# Patient Record
Sex: Male | Born: 1975 | Race: Black or African American | Hispanic: No | Marital: Single | Smoking: Never smoker
Health system: Southern US, Community
[De-identification: ages and names within clinical notes are randomized; demographics above are authoritative.]

## PROBLEM LIST (undated history)

## (undated) HISTORY — PX: APPENDECTOMY: SHX54

---

## 2000-06-02 ENCOUNTER — Encounter: Payer: Self-pay | Admitting: Emergency Medicine

## 2000-06-02 ENCOUNTER — Emergency Department (HOSPITAL_COMMUNITY): Admission: EM | Admit: 2000-06-02 | Discharge: 2000-06-02 | Payer: Self-pay | Admitting: Emergency Medicine

## 2009-04-01 ENCOUNTER — Encounter: Payer: Self-pay | Admitting: Orthopedic Surgery

## 2009-04-01 ENCOUNTER — Emergency Department (HOSPITAL_COMMUNITY): Admission: EM | Admit: 2009-04-01 | Discharge: 2009-04-01 | Payer: Self-pay | Admitting: Emergency Medicine

## 2009-04-06 ENCOUNTER — Emergency Department (HOSPITAL_COMMUNITY): Admission: EM | Admit: 2009-04-06 | Discharge: 2009-04-06 | Payer: Self-pay | Admitting: Emergency Medicine

## 2009-04-13 ENCOUNTER — Emergency Department (HOSPITAL_COMMUNITY): Admission: EM | Admit: 2009-04-13 | Discharge: 2009-04-13 | Payer: Self-pay | Admitting: Emergency Medicine

## 2009-04-16 ENCOUNTER — Ambulatory Visit (HOSPITAL_COMMUNITY): Admission: RE | Admit: 2009-04-16 | Discharge: 2009-04-16 | Payer: Self-pay | Admitting: Orthopedic Surgery

## 2009-04-16 ENCOUNTER — Ambulatory Visit: Payer: Self-pay | Admitting: Orthopedic Surgery

## 2009-04-16 DIAGNOSIS — S20229A Contusion of unspecified back wall of thorax, initial encounter: Secondary | ICD-10-CM | POA: Insufficient documentation

## 2009-04-16 DIAGNOSIS — S7000XA Contusion of unspecified hip, initial encounter: Secondary | ICD-10-CM

## 2009-04-16 DIAGNOSIS — S8000XA Contusion of unspecified knee, initial encounter: Secondary | ICD-10-CM | POA: Insufficient documentation

## 2009-04-17 ENCOUNTER — Encounter: Payer: Self-pay | Admitting: Orthopedic Surgery

## 2009-04-18 ENCOUNTER — Encounter: Payer: Self-pay | Admitting: Orthopedic Surgery

## 2009-04-23 ENCOUNTER — Ambulatory Visit: Payer: Self-pay | Admitting: Orthopedic Surgery

## 2009-04-23 ENCOUNTER — Emergency Department (HOSPITAL_COMMUNITY): Admission: EM | Admit: 2009-04-23 | Discharge: 2009-04-23 | Payer: Self-pay | Admitting: Emergency Medicine

## 2009-04-24 ENCOUNTER — Telehealth: Payer: Self-pay | Admitting: Orthopedic Surgery

## 2009-05-04 ENCOUNTER — Telehealth: Payer: Self-pay | Admitting: Orthopedic Surgery

## 2009-06-12 ENCOUNTER — Encounter: Payer: Self-pay | Admitting: Orthopedic Surgery

## 2010-03-05 NOTE — Letter (Signed)
Summary: Medical record request [facility]  Medical record request [facility]   Imported By: Cammie Sickle 06/16/2009 18:00:19  _____________________________________________________________________  External Attachment:    Type:   Image     Comment:   External Document

## 2010-03-05 NOTE — Letter (Signed)
Summary: History form  History form   Imported By: Jacklynn Ganong 04/25/2009 16:17:39  _____________________________________________________________________  External Attachment:    Type:   Image     Comment:   External Document

## 2010-03-05 NOTE — Letter (Signed)
Summary: Xray order  Xray order   Imported By: Cammie Sickle 04/16/2009 10:33:38  _____________________________________________________________________  External Attachment:    Type:   Image     Comment:   External Document
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
## 2010-03-05 NOTE — Letter (Signed)
Summary: Out of Work  [facility]  [address]  [place], [place] [address]   Phone: [phone]  Fax: [phone]    April 16, 2009   Employee:  Vinny A Dayhoff    To Whom It May Concern:   For Medical reasons, please excuse the above named employee from work for the following dates:  Start:   04-16-2009  End:   04-17-2009  If you need additional information, please feel free to contact our office.         Sincerely,    Fuller Canada MD

## 2010-03-05 NOTE — Progress Notes (Signed)
Summary: Referral to Chiropractor.  Phone Note Outgoing Call   Call placed by: Waldon Reining,  April 24, 2009 11:18 AM Call placed to: Specialist Action Taken: Information Sent Summary of Call: I faxed a referral for this patient to Dr. Reatha Harps, Chiropractor.
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
## 2010-03-05 NOTE — Letter (Signed)
Summary: Medical record request [facility] [employer] [facility]  Medical record request [facility] [facility]   Imported By: Cammie Sickle 05/28/2009 08:57:19  _____________________________________________________________________  External Attachment:    Type:   Image     Comment:   External Document

## 2010-03-05 NOTE — Letter (Signed)
Summary: Out of Work 04/18/09- 05/30/09  [facility]  [address]  [place], [place] [address]   Phone: [phone]  Fax: [phone]    April 23, 2009   Employee:  Prateek A Ursua    To Whom It May Concern:   For Medical reasons, please excuse the above named employee from work for the following dates:  Start:   Weds 16th March 2011  End:   Wed apr 27th 2011   If you need additional information, please feel free to contact our office.         Sincerely,    Fuller Canada MD

## 2010-03-05 NOTE — Letter (Signed)
Summary: Out of Work  [facility]  [address]  [place], [place] [address]   Phone: [phone]  Fax: [phone]    April 23, 2009   Employee:  Ji A Gillooly    To Whom It May Concern:   For Medical reasons, please excuse the above named employee from work for the following dates:  Start:    End:    If you need additional information, please feel free to contact our office.         Sincerely,    Fuller Canada MD

## 2010-03-05 NOTE — Letter (Signed)
Summary: Letter [employer]   Letter [employer]   Imported By: Cammie Sickle 06/05/2009 10:48:56  _____________________________________________________________________  External Attachment:    Type:   Image     Comment:   External Document

## 2010-03-05 NOTE — Progress Notes (Signed)
Summary: Patient not scheduled yet  Phone Note From Other Clinic   Summary of Call: [name] at Dr. Lubertha Basque office called to say she has not scheduled Johnny Andrade yet because he has not returned her calls.  She has left messages for him but has not heard from him yet. Initial call taken by: Jacklynn Ganong,  May 04, 2009 8:52 AM

## 2010-04-02 ENCOUNTER — Emergency Department (HOSPITAL_COMMUNITY): Payer: No Typology Code available for payment source

## 2010-04-02 ENCOUNTER — Emergency Department (HOSPITAL_COMMUNITY)
Admission: EM | Admit: 2010-04-02 | Discharge: 2010-04-02 | Disposition: A | Discharge status: Home / Self Care | Payer: No Typology Code available for payment source | Attending: Emergency Medicine | Admitting: Emergency Medicine

## 2010-04-02 DIAGNOSIS — M549 Dorsalgia, unspecified: Secondary | ICD-10-CM | POA: Insufficient documentation

## 2010-04-02 DIAGNOSIS — S139XXA Sprain of joints and ligaments of unspecified parts of neck, initial encounter: Secondary | ICD-10-CM | POA: Insufficient documentation

## 2010-04-02 DIAGNOSIS — S8000XA Contusion of unspecified knee, initial encounter: Secondary | ICD-10-CM | POA: Insufficient documentation

## 2014-10-10 ENCOUNTER — Encounter (HOSPITAL_COMMUNITY): Payer: Self-pay

## 2014-10-10 ENCOUNTER — Emergency Department (HOSPITAL_COMMUNITY): Payer: BLUE CROSS/BLUE SHIELD

## 2014-10-10 ENCOUNTER — Emergency Department (HOSPITAL_COMMUNITY)
Admission: EM | Admit: 2014-10-10 | Discharge: 2014-10-10 | Disposition: A | Discharge status: Home / Self Care | Payer: BLUE CROSS/BLUE SHIELD | Attending: Emergency Medicine | Admitting: Emergency Medicine

## 2014-10-10 DIAGNOSIS — N433 Hydrocele, unspecified: Secondary | ICD-10-CM | POA: Diagnosis not present

## 2014-10-10 DIAGNOSIS — N5089 Other specified disorders of the male genital organs: Secondary | ICD-10-CM

## 2014-10-10 DIAGNOSIS — Z9049 Acquired absence of other specified parts of digestive tract: Secondary | ICD-10-CM | POA: Diagnosis not present

## 2014-10-10 DIAGNOSIS — R1031 Right lower quadrant pain: Secondary | ICD-10-CM

## 2014-10-10 DIAGNOSIS — N50811 Right testicular pain: Secondary | ICD-10-CM

## 2014-10-10 DIAGNOSIS — R1030 Lower abdominal pain, unspecified: Secondary | ICD-10-CM | POA: Diagnosis present

## 2014-10-10 MED ORDER — HYDROCODONE-ACETAMINOPHEN 5-325 MG PO TABS: 1.0000 | ORAL_TABLET | ORAL | Status: DC | PRN

## 2014-10-10 NOTE — Discharge Instructions (Signed)
Your have a hydrocele involving the right testicle. You have excellent blood flow to the testicle at this time, the hydrocele is pressing near the right testicle. It is extremely important that you see the urology specialist at [facility] urology as sone as possible. You may use Tylenol Extra Strength for mild pain. You may use Norco for more severe pain. Hydrocele, Adult Fluid can collect around the testicles. This fluid forms in a sac. This condition is called a hydrocele. The collected fluid causes swelling of the scrotum. Usually, it affects just one testicle. Most of the time, the condition does not cause pain. Sometimes, the hydrocele goes away on its own. Other times, surgery is needed to get rid of the fluid. CAUSES A hydrocele does not develop often. Different things can cause a hydrocele in a man, including:  Injury to the scrotum.  Infection.  X-ray of the area around the scrotum.  A tumor or cancer of the testicle.  Twisting of a testicle.  Decreased blood flow to the scrotum. SYMPTOMS   Swelling without pain. The hydrocele feels like a water-filled balloon.  Swelling with pain. This can occur if the hydrocele was caused by infection or twisting.  Mild discomfort in the scrotum.  The hydrocele may feel heavy.  Swelling that gets smaller when you lie down. DIAGNOSIS  Your caregiver will do a physical exam to decide if you have a hydrocele. This may include:  Asking questions about your overall health, today and in the past. Your caregiver may ask about any injuries, X-rays, or infections.  Pushing on your abdomen or asking you to change positions to see if the size of the hydrocele changes.  Shining a light through the scrotum (transillumination) to see if the fluid inside the scrotum is clear.  Blood tests and urine tests to check for infection.  Imaging studies that take pictures of the scrotum and testicles. TREATMENT  Treatment depends in part on what caused the  condition. Options include:  Watchful waiting. Your caregiver checks the hydrocele every so often.  Different surgeries to drain the fluid.  A needle may be put into the scrotum to drain fluid (needle aspiration). Fluid often returns after this type of treatment.  A cut (incision) may be made in the scrotum to remove the fluid sac (hydrocelectomy).  An incision may be made in the groin to repair a hydrocele that has contact with abdominal fluids (communicating hydrocele).  Medicines to treat an infection (antibiotics). HOME CARE INSTRUCTIONS  What you need to do at home may depend on the cause of the hydrocele and type of treatment. In general:  Take all medicine as directed by your caregiver. Follow the directions carefully.  Ask your caregiver if there is anything you should not do while you recover (activities, lifting, work, sex).  If you had surgery to repair a communicating hydrocele, recovery time may vary. Ask you caregiver about your recovery time.  Avoid heavy lifting for 4 to 6 weeks.  If you had an incision on the scrotum or groin, wash it for 2 to 3 days after surgery. Do this as long as the skin is closed and there are no gaps in the wound. Wash gently, and avoid rubbing the incision.  Keep all follow-up appointments. SEEK MEDICAL CARE IF:   Your scrotum seems to be getting larger.  The area becomes more and more uncomfortable. SEEK IMMEDIATE MEDICAL CARE IF:  You have a fever. Document Released: 07/10/2009 Document Revised: 11/10/2012 Document Reviewed: 07/10/2009  [employer]® Patient Information ©2015 [employer]. This information is not intended to replace advice given to you by your health care provider. Make sure you discuss any questions you have with your health care provider. ° °

## 2014-10-10 NOTE — ED Provider Notes (Signed)
CSN: [id number]     Arrival date & time 10/10/14  0828 History   First MD Initiated Contact with Patient 10/10/14 [phone]     Chief Complaint  Patient presents with  . Groin Pain     (Consider location/radiation/quality/duration/timing/severity/associated sxs/prior Treatment) HPI Comments: Patient is a 39 year old male who presents to the emergency department with a complaint of groin pain, and swelling in the right groin.  The patient states that he was in his usual state of good health until yesterday when he noted some swelling and pain in the right groin and in the right scrotal area. 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 He also states he just moved into an almost 4 weekend, and had been lifting heavy appliances and furniture. He denies any problem with urination. He denies any direct injury or trauma to the right groin and right testicle area. He's not had any previous problem with swelling or pain in that area. The patient denies any problem with urination. No blood in the urine. Nothing seems to make this issue any better. Certain movements seem to make it worse.  The history is provided by the patient.    History reviewed. No pertinent past medical history. Past Surgical History  Procedure Laterality Date  . Appendectomy     No family history on file. Social History  Substance Use Topics  . Smoking status: Never Smoker   . Smokeless tobacco: None  . Alcohol Use: No    Review of Systems  Genitourinary: Positive for scrotal swelling and testicular pain. Negative for dysuria, hematuria, discharge, difficulty urinating and penile pain.  All other systems reviewed and are negative.     Allergies  Pollen extract  Home Medications   Prior to Admission medications   Medication Sig Start Date End Date Taking? Authorizing Provider  naproxen sodium (ANAPROX) 220 MG tablet Take 440 mg by mouth daily as needed.   Yes Historical Provider, MD   BP  121/83 mmHg  Pulse 60  Temp(Src) 98 F (36.7 C) (Oral)  Resp 13  Ht 6' 1"  (1.854 m)  Wt 178 lb (80.74 kg)  BMI 23.49 kg/m2  SpO2 99% Physical Exam  Constitutional: He is oriented to person, place, and time. He appears well-developed and well-nourished.  Non-toxic appearance.  HENT:  Head: Normocephalic.  Right Ear: Tympanic membrane and external ear normal.  Left Ear: Tympanic membrane and external ear normal.  Eyes: EOM and lids are normal. Pupils are equal, round, and reactive to light.  Neck: Normal range of motion. Neck supple. Carotid bruit is not present.  Cardiovascular: Normal rate, regular rhythm, normal heart sounds, intact distal pulses and normal pulses.   Pulmonary/Chest: Breath sounds normal. No respiratory distress.  Abdominal: Soft. Bowel sounds are normal. There is no tenderness. There is no guarding. Hernia confirmed negative in the right inguinal area and confirmed negative in the left inguinal area.  Genitourinary: Right testis shows mass, swelling and tenderness. Left testis shows no mass, no swelling and no tenderness. Circumcised. No penile tenderness. No discharge found.  There is significant swelling of the right scrotal area. There is tenderness of the right testicle area. No swelling or tenderness or mass of the left.  Musculoskeletal: Normal range of motion.  Lymphadenopathy:       Head (right side): No submandibular adenopathy present.       Head (left side): No submandibular adenopathy present.    He has no cervical adenopathy.  Neurological: He is alert and oriented to person, place, and time. He has normal strength. No cranial nerve deficit or sensory deficit.  Skin: Skin is warm and dry.  Psychiatric: He has a normal mood and affect. His speech is normal.  Nursing note and vitals reviewed.   ED Course  Pt seen with me by Dr Roderic Palau  Procedures (including critical care time) Labs Review Labs Reviewed - No data to display  Imaging Review US  Scrotum  10/10/2014   CLINICAL DATA:  Right scrotal region swelling and pain. Patient recently lifting heavy objects  EXAM: SCROTAL ULTRASOUND  DOPPLER ULTRASOUND OF THE TESTICLES  TECHNIQUE: Complete ultrasound examination of the testicles, epididymis, and other scrotal structures was performed. Color and spectral Doppler ultrasound were also utilized to evaluate blood flow to the testicles.  COMPARISON:  None.  FINDINGS: Right testicle  Measurements: 6.3 x 2.7 x 3.1 cm. No mass visualized within right testis. 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  Left testicle  Measurements: 5.7 x 2.8 x 2.7 cm. No mass visualized within the left testis. 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  Right epididymis:  Normal in size and appearance.  Left epididymis:  Normal in size and appearance.  Hydrocele: There is a large multi septated hydrocele on the right which impresses upon the right testis. There is no appreciable hydrocele on the left.  Varicocele: There is a small varicocele on the left. There is no varicocele on the right.  Pulsed Doppler interrogation of both testes demonstrates normal low resistance arterial and venous waveforms bilaterally. The peak systolic velocity in the right testis is 5 cm/sec. The peak systolic velocity in the left testis is 5 cm/sec.  There is no appreciable scrotal abscess or scrotal wall thickening on either side.  IMPRESSION: Sizable complex multi-septated hydrocele on the right causing impression on the right testis. Etiology for this hydrocele is uncertain. The septations could be indicative of prior infection or hemorrhage.  There is no intratesticular mass or torsion on either side. The epididymal structures appear normal bilaterally. 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 Current literature suggests that testicular microlithiasis is not a significant independent risk factor for development of testicular carcinoma, and that follow up imaging is not warranted in the absence of  other risk factors. Monthly testicular self-examination and annual physical exams are considered appropriate surveillance. If patient has other risk factors for testicular carcinoma, then referral to Urology should be considered. (Reference: DeCastro, et al.: A 5-Year Follow up Study of Asymptomatic Men with Testicular Microlithiasis. J Urol 2008; 151:7616-0737.)  There is a small varicocele on the left.   Electronically Signed   By: Lowella Grip III M.D.   On: 10/10/2014 12:55   [place] Art/ven Flow Abd Pelv Doppler  10/10/2014   CLINICAL DATA:  Right scrotal region swelling and pain. Patient recently lifting heavy objects  EXAM: SCROTAL ULTRASOUND  DOPPLER ULTRASOUND OF THE TESTICLES  TECHNIQUE: Complete ultrasound examination of the testicles, epididymis, and other scrotal structures was performed. Color and spectral Doppler ultrasound were also utilized to evaluate blood flow to the testicles.  COMPARISON:  None.  FINDINGS: Right testicle  Measurements: 6.3 x 2.7 x 3.1 cm. No mass visualized within right testis. 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  Left testicle  Measurements: 5.7 x 2.8 x 2.7 cm. No mass visualized within the left testis. 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  Right epididymis:  Normal in size and appearance.  Left epididymis:  Normal in size and appearance.  Hydrocele: There is a  large multi septated hydrocele on the right which impresses upon the right testis. There is no appreciable hydrocele on the left.  Varicocele: There is a small varicocele on the left. There is no varicocele on the right.  Pulsed Doppler interrogation of both testes demonstrates normal low resistance arterial and venous waveforms bilaterally. The peak systolic velocity in the right testis is 5 cm/sec. The peak systolic velocity in the left testis is 5 cm/sec.  There is no appreciable scrotal abscess or scrotal wall thickening on either side.  IMPRESSION: Sizable complex multi-septated hydrocele on the right  causing impression on the right testis. Etiology for this hydrocele is uncertain. The septations could be indicative of prior infection or hemorrhage.  There is no intratesticular mass or torsion on either side. The epididymal structures appear normal bilaterally. 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 Current literature suggests that testicular microlithiasis is not a significant independent risk factor for development of testicular carcinoma, and that follow up imaging is not warranted in the absence of other risk factors. Monthly testicular self-examination and annual physical exams are considered appropriate surveillance. If patient has other risk factors for testicular carcinoma, then referral to Urology should be considered. (Reference: DeCastro, et al.: A 5-Year Follow up Study of Asymptomatic Men with Testicular Microlithiasis. J Urol 2008; 601:0932-3557.)  There is a small varicocele on the left.   Electronically Signed   By: Lowella Grip III M.D.   On: 10/10/2014 12:55   I have personally reviewed and evaluated these images and lab results as part of my medical decision-making.   EKG Interpretation None      MDM  Vital signs were within normal limits. No history of direct trauma to the groin or scrotal area. The patient has swelling of the right scrotal area. I cannot demonstrate hernia at this time.  Ultrasound reveals no evidence of torsion at this time. There is a sizable complex multiseptated hydrocele on the right which is causing an impression on the right testes. No intratesticular mass or torsion appreciated. The epididymal structures appear normal.  I have strongly encouraged the patient to see the physicians at the [facility] as soon as possible. Excuse for work as been given to the patient. The patient is treated with Norco every 4 hours if needed for pain. Patient acknowledges understanding of the examination findings and the ultrasound findings, and the  urgency to see urology.    Final diagnoses:  Scrotal swelling  Pain in right testicle  Groin pain, right    *I have reviewed nursing notes, vital signs, and all appropriate lab and imaging results for this patient.[address] PA-C 10/12/14 [place], MD 10/13/14 [phone]

## 2014-10-10 NOTE — ED Notes (Signed)
Pt reports had moved into a new home over the weekend and lifted heavy appliances.  Reports last night started having pain and swelling in r groin.

## 2014-10-25 ENCOUNTER — Ambulatory Visit (INDEPENDENT_AMBULATORY_CARE_PROVIDER_SITE_OTHER): Payer: BLUE CROSS/BLUE SHIELD | Admitting: Urology

## 2014-10-25 DIAGNOSIS — N433 Hydrocele, unspecified: Secondary | ICD-10-CM | POA: Diagnosis not present

## 2014-10-25 DIAGNOSIS — N508 Other specified disorders of male genital organs: Secondary | ICD-10-CM | POA: Diagnosis not present

## 2016-12-06 ENCOUNTER — Emergency Department (HOSPITAL_COMMUNITY)
Admission: EM | Admit: 2016-12-06 | Discharge: 2016-12-06 | Disposition: A | Discharge status: Home / Self Care | Payer: BLUE CROSS/BLUE SHIELD | Attending: Emergency Medicine | Admitting: Emergency Medicine

## 2016-12-06 ENCOUNTER — Encounter (HOSPITAL_COMMUNITY): Payer: Self-pay | Admitting: *Deleted

## 2016-12-06 DIAGNOSIS — Y92410 Unspecified street and highway as the place of occurrence of the external cause: Secondary | ICD-10-CM | POA: Diagnosis not present

## 2016-12-06 DIAGNOSIS — S4991XA Unspecified injury of right shoulder and upper arm, initial encounter: Secondary | ICD-10-CM | POA: Diagnosis present

## 2016-12-06 DIAGNOSIS — Y9389 Activity, other specified: Secondary | ICD-10-CM | POA: Insufficient documentation

## 2016-12-06 DIAGNOSIS — S39012A Strain of muscle, fascia and tendon of lower back, initial encounter: Secondary | ICD-10-CM | POA: Diagnosis not present

## 2016-12-06 DIAGNOSIS — S46811A Strain of other muscles, fascia and tendons at shoulder and upper arm level, right arm, initial encounter: Secondary | ICD-10-CM | POA: Insufficient documentation

## 2016-12-06 DIAGNOSIS — Y999 Unspecified external cause status: Secondary | ICD-10-CM | POA: Diagnosis not present

## 2016-12-06 MED ORDER — IBUPROFEN 800 MG PO TABS
800.0000 mg | ORAL_TABLET | Freq: Once | ORAL | Status: AC
  Administered 2016-12-06: 800 mg via ORAL
  Filled 2016-12-06: qty 1

## 2016-12-06 MED ORDER — IBUPROFEN 600 MG PO TABS: 600.0000 mg | ORAL_TABLET | Freq: Four times a day (QID) | ORAL | 0 refills | Status: DC

## 2016-12-06 MED ORDER — DIAZEPAM 5 MG PO TABS
10.0000 mg | ORAL_TABLET | Freq: Once | ORAL | Status: AC
  Administered 2016-12-06: 10 mg via ORAL
  Filled 2016-12-06: qty 2

## 2016-12-06 MED ORDER — ONDANSETRON HCL 4 MG PO TABS
4.0000 mg | ORAL_TABLET | Freq: Once | ORAL | Status: AC
  Administered 2016-12-06: 4 mg via ORAL
  Filled 2016-12-06: qty 1

## 2016-12-06 MED ORDER — CYCLOBENZAPRINE HCL 10 MG PO TABS: 10.0000 mg | ORAL_TABLET | Freq: Three times a day (TID) | ORAL | 0 refills | Status: DC
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
## 2016-12-14 ENCOUNTER — Other Ambulatory Visit: Payer: Self-pay

## 2016-12-14 ENCOUNTER — Emergency Department (HOSPITAL_COMMUNITY)
Admission: EM | Admit: 2016-12-14 | Discharge: 2016-12-14 | Disposition: A | Discharge status: Home / Self Care | Payer: BLUE CROSS/BLUE SHIELD | Attending: Emergency Medicine | Admitting: Emergency Medicine

## 2016-12-14 ENCOUNTER — Encounter (HOSPITAL_COMMUNITY): Payer: Self-pay

## 2016-12-14 DIAGNOSIS — M25511 Pain in right shoulder: Secondary | ICD-10-CM | POA: Diagnosis not present

## 2016-12-14 DIAGNOSIS — M7918 Myalgia, other site: Secondary | ICD-10-CM | POA: Insufficient documentation

## 2016-12-14 DIAGNOSIS — M545 Low back pain: Secondary | ICD-10-CM | POA: Diagnosis not present

## 2016-12-14 MED ORDER — PREDNISONE 10 MG PO TABS: 50.0000 mg | ORAL_TABLET | Freq: Every day | ORAL | 0 refills | Status: AC

## 2016-12-14 MED ORDER — LIDOCAINE 5 % EX PTCH: 1.0000 | MEDICATED_PATCH | CUTANEOUS | 0 refills | Status: DC
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
## 2016-12-14 NOTE — ED Notes (Signed)
Pt alert & oriented x4, stable gait. Patient given discharge instructions, paperwork & prescription(s). Patient  instructed to stop at the registration desk to finish any additional paperwork. Patient verbalized understanding. Pt left department w/ no further questions. 
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
## 2016-12-14 NOTE — ED Notes (Signed)
Hourly rounding  Informed of 5 hour wait  Thanked for patience  Assured would be seen as soon as possible

## 2016-12-14 NOTE — Discharge Instructions (Signed)
Take prednisone as prescribed.  Do not take anti-inflammatories while taking this medicine (Advil, Motrin, naproxen, Aleve, ibuprofen) as this will cause stomach upset. Use Tylenol as needed for pain. Use Lidoderm patches as needed for pain. Use heat or ice if this helps control your symptoms. Follow-up with Dr. Aline Brochure for further evaluation and management of your back pain. Return to the emergency room if you develop loss of bowel or bladder control, numbness, or any new or worsening symptoms.

## 2016-12-31 ENCOUNTER — Ambulatory Visit (INDEPENDENT_AMBULATORY_CARE_PROVIDER_SITE_OTHER): Payer: BLUE CROSS/BLUE SHIELD

## 2016-12-31 ENCOUNTER — Ambulatory Visit: Payer: BLUE CROSS/BLUE SHIELD | Admitting: Orthopaedic Surgery

## 2016-12-31 ENCOUNTER — Encounter: Payer: Self-pay | Admitting: Orthopaedic Surgery

## 2016-12-31 VITALS — BP 136/88 | HR 65 | Temp 98.5°F | Ht 71.0 in | Wt 175.0 lb

## 2016-12-31 DIAGNOSIS — M549 Dorsalgia, unspecified: Secondary | ICD-10-CM

## 2016-12-31 DIAGNOSIS — M542 Cervicalgia: Secondary | ICD-10-CM | POA: Diagnosis not present

## 2016-12-31 DIAGNOSIS — M25511 Pain in right shoulder: Secondary | ICD-10-CM | POA: Diagnosis not present

## 2016-12-31 MED ORDER — HYDROCODONE-ACETAMINOPHEN 7.5-325 MG PO TABS: ORAL_TABLET | ORAL | 0 refills | Status: DC

## 2016-12-31 MED ORDER — NAPROXEN 500 MG PO TABS: 500.0000 mg | ORAL_TABLET | Freq: Two times a day (BID) | ORAL | 5 refills | Status: DC

## 2016-12-31 MED ORDER — CYCLOBENZAPRINE HCL 10 MG PO TABS: 10.0000 mg | ORAL_TABLET | Freq: Three times a day (TID) | ORAL | 1 refills | Status: DC
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
## 2016-12-31 NOTE — Patient Instructions (Signed)
OUT OF WORK, Next 7 (seven) days

## 2017-01-15 ENCOUNTER — Encounter: Payer: Self-pay | Admitting: Orthopaedic Surgery

## 2017-01-15 ENCOUNTER — Encounter (HOSPITAL_COMMUNITY): Payer: Self-pay

## 2017-01-15 ENCOUNTER — Ambulatory Visit (HOSPITAL_COMMUNITY): Payer: BLUE CROSS/BLUE SHIELD | Attending: Orthopaedic Surgery

## 2017-01-15 ENCOUNTER — Ambulatory Visit (INDEPENDENT_AMBULATORY_CARE_PROVIDER_SITE_OTHER): Payer: BLUE CROSS/BLUE SHIELD | Admitting: Orthopaedic Surgery

## 2017-01-15 ENCOUNTER — Other Ambulatory Visit: Payer: Self-pay

## 2017-01-15 VITALS — BP 123/76 | HR 61 | Temp 97.5°F | Ht 71.0 in | Wt 180.0 lb

## 2017-01-15 DIAGNOSIS — M542 Cervicalgia: Secondary | ICD-10-CM | POA: Diagnosis not present

## 2017-01-15 DIAGNOSIS — M25511 Pain in right shoulder: Secondary | ICD-10-CM

## 2017-01-15 DIAGNOSIS — M5386 Other specified dorsopathies, lumbar region: Secondary | ICD-10-CM | POA: Insufficient documentation

## 2017-01-15 DIAGNOSIS — M549 Dorsalgia, unspecified: Secondary | ICD-10-CM | POA: Diagnosis not present

## 2017-01-15 DIAGNOSIS — M545 Low back pain: Secondary | ICD-10-CM | POA: Insufficient documentation

## 2017-01-15 DIAGNOSIS — R29898 Other symptoms and signs involving the musculoskeletal system: Secondary | ICD-10-CM | POA: Insufficient documentation

## 2017-01-15 MED ORDER — HYDROCODONE-ACETAMINOPHEN 7.5-325 MG PO TABS: ORAL_TABLET | ORAL | 0 refills | Status: DC

## 2017-01-15 NOTE — Progress Notes (Signed)
Patient Johnny Andrade, male DOB:May 26, 1975, 41 y.o. DVV:[id number]  Chief Complaint  Patient presents with  . Neck Pain    HPI  Johnny Andrade is a 41 y.o. male who has neck pain and upper back pain.  He has been to PT only once and it was today.  He is only slightly better. He still has neck pain.  He is doing the exercises we told him to do and now has new ones from PT.  He is taking his medicine.   HPI  Body mass index is 25.1 kg/m.  ROS  Review of Systems  HENT: Negative for congestion.   Respiratory: Negative for cough and shortness of breath.   Cardiovascular: Negative for chest pain and leg swelling.  Endocrine: Negative for cold intolerance.  Musculoskeletal: Positive for arthralgias and back pain.  Allergic/Immunologic: Negative for environmental allergies.  All other systems reviewed and are negative.   History reviewed. No pertinent past medical history.  Past Surgical History:  Procedure Laterality Date  . APPENDECTOMY      Family History  Problem Relation Age of Onset  . GER disease Mother     Social History Social History   Tobacco Use  . Smoking status: Never Smoker  . Smokeless tobacco: Never Used  Substance Use Topics  . Alcohol use: No  . Drug use: No    Allergies  Allergen Reactions  . Pollen Extract Other (See Comments)    Eye burning/swelling.    Current Outpatient Medications  Medication Sig Dispense Refill  . cyclobenzaprine (FLEXERIL) 10 MG tablet Take 1 tablet (10 mg total) by mouth 3 (three) times daily. 40 tablet 1  . naproxen (NAPROSYN) 500 MG tablet Take 1 tablet (500 mg total) by mouth 2 (two) times daily with a meal. 60 tablet 5  . HYDROcodone-acetaminophen (NORCO) 7.5-325 MG tablet One every four hours for pain as needed.  Do not drive car or operate machinery while taking this medicine.  Must last 14 days. 56 tablet 0  . lidocaine (LIDODERM) 5 % Place 1 patch daily onto the skin. Remove & Discard patch within 12 hours or  as directed by MD (Patient not taking: Reported on 01/15/2017) 30 patch 0   No current facility-administered medications for this visit.      Physical Exam  Blood pressure 123/76, pulse 61, temperature (!) 97.5 F (36.4 C), height 5' 11"  (1.803 m), weight 180 lb (81.6 kg).  Constitutional: overall normal hygiene, normal nutrition, well developed, normal grooming, normal body habitus. Assistive device:none  Musculoskeletal: gait and station Limp none, muscle tone and strength are normal, no tremors or atrophy is present.  .  Neurological: coordination overall normal.  Deep tendon reflex/nerve stretch intact.  Sensation normal.  Cranial nerves II-XII intact.   Skin:   Normal overall no scars, lesions, ulcers or rashes. No psoriasis.  Psychiatric: Alert and oriented x 3.  Recent memory intact, remote memory unclear.  Normal mood and affect. Well groomed.  Good eye contact.  Cardiovascular: overall no swelling, no varicosities, no edema bilaterally, normal temperatures of the legs and arms, no clubbing, cyanosis and good capillary refill.  Lymphatic: palpation is normal.  All other systems reviewed and are negative   Neck pain is still present. He is very tender.  ROM is full but tender.  NV intact.  The patient has been educated about the nature of the problem(s) and counseled on treatment options.  The patient appeared to understand what I have discussed and is  in agreement with it.  Encounter Diagnoses  Name Primary?  . Neck pain Yes  . Mid back pain   . Acute pain of right shoulder     PLAN Call if any problems.  Precautions discussed.  Continue current medications.   Return to clinic 3 weeks   I have reviewed the [facility] web site prior to prescribing narcotic medicine for this patient.  Electronically Signed Sanjuana Kava, MD 12/13/201810:48 AM

## 2017-01-15 NOTE — Patient Instructions (Signed)
  CERVICAL CHIN TUCK - SUPINE WITH TOWEL While lying on your back with a small rolled up towel under the curve of your neck, tuck your chin towards your chest. Maintain contact of your head with the surface you are lying on the entire time. Repeat 15 Times Hold 3 Seconds Complete 1 Set Perform 1 Time(s) a Day   Transverse Abdominus Activation Contract your lower abdominals as if you were trying to lift one leg from the table. Initiate the movement but do no lift foot greater than 1 inch from the table. Repeat opposite side. Repeat 10 Times Hold 5 Seconds Complete 1 Set Perform 2 Time(s) a Day
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
## 2017-01-21 ENCOUNTER — Encounter (HOSPITAL_COMMUNITY): Payer: Self-pay

## 2017-01-21 ENCOUNTER — Ambulatory Visit (HOSPITAL_COMMUNITY): Payer: BLUE CROSS/BLUE SHIELD

## 2017-01-21 DIAGNOSIS — R29898 Other symptoms and signs involving the musculoskeletal system: Secondary | ICD-10-CM

## 2017-01-21 DIAGNOSIS — M545 Low back pain: Secondary | ICD-10-CM

## 2017-01-21 DIAGNOSIS — M542 Cervicalgia: Secondary | ICD-10-CM | POA: Diagnosis not present

## 2017-01-21 DIAGNOSIS — M5386 Other specified dorsopathies, lumbar region: Secondary | ICD-10-CM
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
## 2017-01-29 ENCOUNTER — Ambulatory Visit (HOSPITAL_COMMUNITY): Payer: BLUE CROSS/BLUE SHIELD

## 2017-01-30 ENCOUNTER — Ambulatory Visit (HOSPITAL_COMMUNITY): Payer: BLUE CROSS/BLUE SHIELD

## 2017-01-30 ENCOUNTER — Encounter (HOSPITAL_COMMUNITY): Payer: Self-pay

## 2017-01-30 DIAGNOSIS — M5386 Other specified dorsopathies, lumbar region: Secondary | ICD-10-CM

## 2017-01-30 DIAGNOSIS — R29898 Other symptoms and signs involving the musculoskeletal system: Secondary | ICD-10-CM

## 2017-01-30 DIAGNOSIS — M542 Cervicalgia: Secondary | ICD-10-CM

## 2017-01-30 DIAGNOSIS — M545 Low back pain: Secondary | ICD-10-CM
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
## 2017-02-04 ENCOUNTER — Encounter (HOSPITAL_COMMUNITY): Payer: Self-pay

## 2017-02-04 ENCOUNTER — Ambulatory Visit (HOSPITAL_COMMUNITY): Payer: BLUE CROSS/BLUE SHIELD | Attending: Orthopaedic Surgery

## 2017-02-04 DIAGNOSIS — M5386 Other specified dorsopathies, lumbar region: Secondary | ICD-10-CM

## 2017-02-04 DIAGNOSIS — M545 Low back pain: Secondary | ICD-10-CM | POA: Diagnosis present

## 2017-02-04 DIAGNOSIS — M542 Cervicalgia: Secondary | ICD-10-CM | POA: Diagnosis present

## 2017-02-04 DIAGNOSIS — R29898 Other symptoms and signs involving the musculoskeletal system: Secondary | ICD-10-CM | POA: Insufficient documentation
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
## 2017-02-04 NOTE — Patient Instructions (Signed)
Neck Flexors    Sit with back straight. Tilt head slightly forward while resisting with fingertips. Do not let head move. Keep chin tucked. Do not allow trunk to lean forward. Hold 5 seconds. Repeat 5 times. CAUTION: Use steady pressure with fingertips only.  Neck: Lateral Tilt    Sit with back straight. Tilt head slightly to right while resisting with fingertips. Do not let head move. Do not allow trunk to lean sideways. Hold 5 seconds. Repeat 5 times.  CAUTION: Use steady pressure with fingertips only.  Copyright  VHI. All rights reserved.   Isometric Rotation    Put left index finger on left temple. Gently try to turn head to right, pushing against finger. Hold 5 seconds. Repeat on the left. Push and release slowly. Repeat 5 times.   http://[email or web address].exer.us/24   Copyright  VHI. All rights reserved.

## 2017-02-05 ENCOUNTER — Ambulatory Visit (INDEPENDENT_AMBULATORY_CARE_PROVIDER_SITE_OTHER): Payer: BLUE CROSS/BLUE SHIELD | Admitting: Orthopaedic Surgery

## 2017-02-05 ENCOUNTER — Encounter: Payer: Self-pay | Admitting: Orthopaedic Surgery

## 2017-02-05 VITALS — BP 126/86 | HR 76 | Temp 98.0°F | Ht 71.0 in | Wt 179.0 lb

## 2017-02-05 DIAGNOSIS — M549 Dorsalgia, unspecified: Secondary | ICD-10-CM | POA: Diagnosis not present

## 2017-02-05 DIAGNOSIS — M25511 Pain in right shoulder: Secondary | ICD-10-CM

## 2017-02-05 DIAGNOSIS — M542 Cervicalgia: Secondary | ICD-10-CM | POA: Diagnosis not present

## 2017-02-05 MED ORDER — CYCLOBENZAPRINE HCL 10 MG PO TABS: 10.0000 mg | ORAL_TABLET | Freq: Three times a day (TID) | ORAL | 1 refills | Status: DC

## 2017-02-05 MED ORDER — HYDROCODONE-ACETAMINOPHEN 7.5-325 MG PO TABS: ORAL_TABLET | ORAL | 0 refills | Status: DC

## 2017-02-05 NOTE — Progress Notes (Signed)
Patient GU:RKYHCWC Johnny Andrade, male DOB:[birth year], 42 y.o. BJS:[id number]  Chief Complaint  Patient presents with  . Follow-up    Neck    HPI  Johnny Andrade is a 42 y.o. male who has continued pain of the neck the lower back and the right shoulder.  The right shoulder is improved.  He has been going to PT and is making progress.  He has more pain in movement of the neck to the right.  He has no paresthesias.  His lower back is more painful.  He will continue PT.  He has been taking his medicine and doing his exercises. HPI  Body mass index is 24.97 kg/m.  ROS  Review of Systems  HENT: Negative for congestion.   Respiratory: Negative for cough and shortness of breath.   Cardiovascular: Negative for chest pain and leg swelling.  Endocrine: Negative for cold intolerance.  Musculoskeletal: Positive for arthralgias and back pain.  Allergic/Immunologic: Negative for environmental allergies.  All other systems reviewed and are negative.   History reviewed. No pertinent past medical history.  Past Surgical History:  Procedure Laterality Date  . APPENDECTOMY      Family History  Problem Relation Age of Onset  . GER disease Mother     Social History Social History   Tobacco Use  . Smoking status: Never Smoker  . Smokeless tobacco: Never Used  Substance Use Topics  . Alcohol use: No  . Drug use: No    Allergies  Allergen Reactions  . Pollen Extract Other (See Comments)    Eye burning/swelling.    Current Outpatient Medications  Medication Sig Dispense Refill  . cyclobenzaprine (FLEXERIL) 10 MG tablet Take 1 tablet (10 mg total) by mouth 3 (three) times daily. 40 tablet 1  . HYDROcodone-acetaminophen (NORCO) 7.5-325 MG tablet One every four hours for pain as needed.  Do not drive car or operate machinery while taking this medicine.  Must last 14 days. 56 tablet 0  . lidocaine (LIDODERM) 5 % Place 1 patch daily onto the skin. Remove & Discard patch within 12 hours or as  directed by MD (Patient not taking: Reported on 01/15/2017) 30 patch 0  . naproxen (NAPROSYN) 500 MG tablet Take 1 tablet (500 mg total) by mouth 2 (two) times daily with a meal. 60 tablet 5   No current facility-administered medications for this visit.      Physical Exam  Blood pressure 126/86, pulse 76, temperature 98 F (36.7 C), height 5' 11"  (1.803 m), weight 179 lb (81.2 kg).  Constitutional: overall normal hygiene, normal nutrition, well developed, normal grooming, normal body habitus. Assistive device:none  Musculoskeletal: gait and station Limp none, muscle tone and strength are normal, no tremors or atrophy is present.  .  Neurological: coordination overall normal.  Deep tendon reflex/nerve stretch intact.  Sensation normal.  Cranial nerves II-XII intact.   Skin:   Normal overall no scars, lesions, ulcers or rashes. No psoriasis.  Psychiatric: Alert and oriented x 3.  Recent memory intact, remote memory unclear.  Normal mood and affect. Well groomed.  Good eye contact.  Cardiovascular: overall no swelling, no varicosities, no edema bilaterally, normal temperatures of the legs and arms, no clubbing, cyanosis and good capillary refill.  Lymphatic: palpation is normal.  All other systems reviewed and are negative   Neck is more tender on the right side with decreased motion to the right.  He has no spasm.  NV is intact.  Grips are normal.  The right  shoulder has near full motion but is tender in the extremes.   Spine/Pelvis examination:  Inspection:  Overall, sacoiliac joint benign and hips nontender; without crepitus or defects.   Thoracic spine inspection: Alignment normal without kyphosis present   Lumbar spine inspection:  Alignment  with normal lumbar lordosis, without scoliosis apparent.   Thoracic spine palpation:  with tenderness of spinal processes   Lumbar spine palpation: with tenderness of lumbar area; without tightness of lumbar muscles    Range of  Motion:   Lumbar flexion, forward flexion is 45 without pain or tenderness    Lumbar extension is 10 without pain or tenderness   Left lateral bend is Normal  without pain or tenderness   Right lateral bend is Normal without pain or tenderness   Straight leg raising is Normal   Strength & tone: Normal   Stability overall normal stability    The patient has been educated about the nature of the problem(s) and counseled on treatment options.  The patient appeared to understand what I have discussed and is in agreement with it.  Encounter Diagnoses  Name Primary?  . Neck pain Yes  . Mid back pain   . Acute pain of right shoulder     PLAN Call if any problems.  Precautions discussed.  Continue current medications.   Return to clinic 3 weeks   Continue PT. I have reviewed the [facility] web site prior to prescribing narcotic medicine for this patient.  Electronically Signed Sanjuana Kava, MD 1/3/201910:19 AM

## 2017-02-06 ENCOUNTER — Ambulatory Visit (HOSPITAL_COMMUNITY): Payer: BLUE CROSS/BLUE SHIELD

## 2017-02-06 ENCOUNTER — Encounter (HOSPITAL_COMMUNITY): Payer: Self-pay

## 2017-02-06 DIAGNOSIS — M542 Cervicalgia: Secondary | ICD-10-CM | POA: Diagnosis not present

## 2017-02-06 DIAGNOSIS — R29898 Other symptoms and signs involving the musculoskeletal system: Secondary | ICD-10-CM

## 2017-02-06 DIAGNOSIS — M5386 Other specified dorsopathies, lumbar region: Secondary | ICD-10-CM

## 2017-02-06 DIAGNOSIS — M545 Low back pain: Secondary | ICD-10-CM
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
## 2017-02-06 NOTE — Patient Instructions (Signed)
Flexibility: Upper Trapezius Stretch    Gently grasp right side of head while reaching behind back with other hand. Tilt head away until a gentle stretch is felt. Hold 30 seconds. Repeat 2-3 times per set.   http://orth.exer.us/340   Copyright  VHI. All rights reserved.

## 2017-02-10 ENCOUNTER — Ambulatory Visit (HOSPITAL_COMMUNITY): Payer: BLUE CROSS/BLUE SHIELD

## 2017-02-10 ENCOUNTER — Encounter (HOSPITAL_COMMUNITY): Payer: Self-pay

## 2017-02-10 DIAGNOSIS — R29898 Other symptoms and signs involving the musculoskeletal system: Secondary | ICD-10-CM

## 2017-02-10 DIAGNOSIS — M542 Cervicalgia: Secondary | ICD-10-CM

## 2017-02-10 DIAGNOSIS — M545 Low back pain: Secondary | ICD-10-CM

## 2017-02-10 DIAGNOSIS — M5386 Other specified dorsopathies, lumbar region: Secondary | ICD-10-CM

## 2017-02-10 NOTE — Patient Instructions (Signed)
Hamstring Step 3    Left leg in maximal straight leg raise, heel at maximal stretch, straighten knee further by tightening knee cap. Warning: Intense stretch. Stay within tolerance. Hold 30 seconds. Relax knee cap only. Repeat 3 times.  Copyright  VHI. All rights reserved.

## 2017-02-10 NOTE — Therapy (Signed)
[place], [place], [address] Phone: [phone]   Fax:  [phone]  Physical Therapy Treatment  Patient Details  Name: Johnny Andrade MRN: [id number] Date of Birth: 1975-12-19 Referring Provider: Sanjuana Kava, MD   Encounter Date: 02/10/2017  PT End of Session - 02/10/17 1742    Visit Number  6    Number of Visits  17    Date for PT Re-Evaluation  01/29/17    Authorization Type  BCBS Other    Authorization Time Period  01/15/17- 03/13/17    Authorization - Visit Number  6    Authorization - Number of Visits  17    PT Start Time  1601    PT Stop Time  1820    PT Time Calculation (min)  45 min    Activity Tolerance  Patient tolerated treatment well;Patient limited by pain;No increased pain    Behavior During Therapy  [facility] for tasks assessed/performed       History reviewed. No pertinent past medical history.  Past Surgical History:  Procedure Laterality Date  . APPENDECTOMY      There were no vitals filed for this visit.  Subjective Assessment - 02/10/17 1738    Subjective  Pt stated he RTW following a week vacation, most difficulty with standing in one spot with increased neck and LBP, pain scale 8/10.      Patient Stated Goals  Patient would like to have less pain at work     Currently in Pain?  Yes    Pain Score  8     Pain Location  Back Neck and LBP    Pain Orientation  Right;Lower    Pain Descriptors / Indicators  Sore;Sharp    Pain Type  Acute pain    Pain Onset  More than a month ago    Pain Frequency  Intermittent    Aggravating Factors   standing for long periods of time with work and head movements    Pain Relieving Factors  HEP heat momentarily    Effect of Pain on Daily Activities  moderately                      [facility] Adult PT Treatment/Exercise - 02/10/17 0001      Neck Exercises: Theraband   Shoulder Extension  10 reps;Red cueing for ab set    Rows  10 reps;Red  cueing for ab set       Neck Exercises: Seated   Neck Retraction  10 reps;3 secs    Postural Training  Educated importance to reduce stress on neck    Other Seated Exercise  Seated thoracic rotation to improve rotatione10x each      Neck Exercises: Supine   Neck Retraction  10 reps;10 secs    Neck Retraction Limitations  chin tuck head lift 10x 10"      Lumbar Exercises: Stretches   Active Hamstring Stretch  2 reps;30 seconds supine wiht sheet, HEP given      Manual Therapy   Manual Therapy  Soft tissue mobilization    Manual therapy comments  Manual complete separate than rest of tx    Soft tissue mobilization  Cervical musculuatre in supine with LE elevated      Neck Exercises: Stretches   Upper Trapezius Stretch  2 reps;30 seconds    Upper Trapezius Stretch Limitations  seated with arm movements  PT Short Term Goals - 02/04/17 1741      PT SHORT TERM GOAL #1   Title  Patient will report understanding and regular compliance with HEP.     Baseline  02/04/2017: Daily      PT SHORT TERM GOAL #2   Title  Patient will report decreased maximum pain from 10/10 pain to 7/10 pain over a 1 week period.     Baseline  02/04/17:  Pain scale ranges from 8-10/10 on Rt neck      PT SHORT TERM GOAL #3   Title  Patient will demonstrate improved cervical muscular endurance maintaining cervical flexion for 10 seconds in supine.     Baseline  01/02: Pt able to maintain cervical flexion for 7 seconds, limited by pain.          PT Long Term Goals - 02/04/17 1743      PT LONG TERM GOAL #1   Title  Patient will report maximum pain of 2/10 over a 1 week period while at work to demonstrate decreased pain and increased activity tolerance.       PT LONG TERM GOAL #2   Title  Patient will demonstrate ability to maintain cervical flexion for 20 seconds demonstrating improved cervical flexion endurance.       PT LONG TERM GOAL #3   Title  Patient will demonstrate improved  cervical flexion, extension, and rotation active range of motion of 10 degrees with only 2/10 pain maximum with each movement.      PT LONG TERM GOAL #4   Title  Patient will have an increased FOTO score to 40% limitation indicating improved ability to perform functional activities.             Plan - 02/10/17 1744    Clinical Impression Statement  Pt continues to present with posterior sacral lean, educated importance of posture to reduce neck and back pain.  Added hamstring stretches to improve LE mobility to assist with posture and continues with posture strengthening therex.  Progressed to theraband postural strengthening with cueing for posture and ab set to assist with lumbar support.  Pt improving cervical strengthening with ability to hold chin tuck holds for 10" without fatigue (STG met), continues to be limited by fatigue.      Rehab Potential  Good    Clinical Impairments Affecting Rehab Potential  Impairments in strength, ROM, and pain primarily    PT Frequency  2x / week    PT Duration  8 weeks    PT Treatment/Interventions  ADLs/Self Care Home Management;Cryotherapy;Electrical Stimulation;Moist Heat;Gait training;Stair training;Functional mobility training;Therapeutic activities;Therapeutic exercise;Balance training;Neuromuscular re-education;Patient/family education;Manual techniques;Passive range of motion    PT Next Visit Plan  Reassess next session.  Continue with cervical mobility and postural strengthening with manual to address cervical restrictions.      PT Home Exercise Plan  TA activation 5 seconds x10 2x/day; Supine chin tucks 3 second holds x15 1x/day; isometric; UT and hamstring stretches 2x 30"    Consulted and Agree with Plan of Care  Patient       Patient will benefit from skilled therapeutic intervention in order to improve the following deficits and impairments:  Impaired sensation, Improper body mechanics, Pain, Decreased mobility, Decreased activity  tolerance, Decreased endurance, Decreased range of motion, Decreased strength, Hypomobility, Difficulty walking, Impaired flexibility  Visit Diagnosis: Neck pain  Midline low back pain, unspecified chronicity, with sciatica presence unspecified  Decreased range of motion of neck  Decreased range of motion  of lumbar spine     Problem List Patient Active Problem List   Diagnosis Date Noted  . CONTUSION, BACK 04/16/2009  . CONTUSION OF HIP 04/16/2009  . CONTUSION, KNEE 04/16/2009   Johnny Andrade, Johnny Andrade; [place]  Johnny Andrade 02/10/2017, 6:30 PM  [facility] [address] [place], [place], [address] Phone: [phone]   Fax:  [phone]  Name: Johnny Andrade MRN: [id number] Date of Birth: [birth year]

## 2017-02-12 ENCOUNTER — Ambulatory Visit (HOSPITAL_COMMUNITY): Payer: BLUE CROSS/BLUE SHIELD

## 2017-02-12 ENCOUNTER — Encounter (HOSPITAL_COMMUNITY): Payer: Self-pay

## 2017-02-12 ENCOUNTER — Other Ambulatory Visit: Payer: Self-pay

## 2017-02-12 DIAGNOSIS — M542 Cervicalgia: Secondary | ICD-10-CM

## 2017-02-12 DIAGNOSIS — M545 Low back pain: Secondary | ICD-10-CM

## 2017-02-12 DIAGNOSIS — R29898 Other symptoms and signs involving the musculoskeletal system: Secondary | ICD-10-CM

## 2017-02-12 DIAGNOSIS — M5386 Other specified dorsopathies, lumbar region: Secondary | ICD-10-CM
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
## 2017-02-13 ENCOUNTER — Encounter: Payer: Self-pay | Admitting: Orthopaedic Surgery

## 2017-02-16 ENCOUNTER — Telehealth: Payer: Self-pay | Admitting: Orthopaedic Surgery

## 2017-02-16 NOTE — Telephone Encounter (Signed)
As per employer group request, [employer] form re-done and re-faxed Fri, 02/13/17. Patient aware.

## 2017-02-18 ENCOUNTER — Ambulatory Visit (HOSPITAL_COMMUNITY): Payer: BLUE CROSS/BLUE SHIELD

## 2017-02-18 ENCOUNTER — Telehealth (HOSPITAL_COMMUNITY): Payer: Self-pay

## 2017-02-18 ENCOUNTER — Other Ambulatory Visit: Payer: Self-pay

## 2017-02-18 ENCOUNTER — Encounter (HOSPITAL_COMMUNITY): Payer: Self-pay

## 2017-02-18 DIAGNOSIS — M542 Cervicalgia: Secondary | ICD-10-CM | POA: Diagnosis not present

## 2017-02-18 DIAGNOSIS — M5386 Other specified dorsopathies, lumbar region: Secondary | ICD-10-CM

## 2017-02-18 DIAGNOSIS — R29898 Other symptoms and signs involving the musculoskeletal system: Secondary | ICD-10-CM

## 2017-02-18 DIAGNOSIS — M545 Low back pain: Secondary | ICD-10-CM

## 2017-02-18 NOTE — Patient Instructions (Signed)
   CHILD POSE - PRAYER STRETCH: 1 set of 10 times with 10 second holds  While in a crawl position, slowly lower your buttocks towards your feet until a stretch is felt along your back and or buttocks.
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
## 2017-02-18 NOTE — Telephone Encounter (Signed)
I called Mr. Rodman mobile phone to offer him an earlier appointment time as there was an opening in my schedule. He stated he would not be able to come in any earlier than 4:45 PM and would like to keep his scheduled time. I told him that is fine and we will see him in our office then.  Kipp Brood, PT, DPT Physical Therapist with [facility]  02/18/2017 3:38 PM

## 2017-02-20 ENCOUNTER — Ambulatory Visit (HOSPITAL_COMMUNITY): Payer: BLUE CROSS/BLUE SHIELD | Admitting: Physical Therapy

## 2017-02-20 ENCOUNTER — Encounter (HOSPITAL_COMMUNITY): Payer: Self-pay | Admitting: Physical Therapy

## 2017-02-20 ENCOUNTER — Other Ambulatory Visit: Payer: Self-pay

## 2017-02-20 DIAGNOSIS — M545 Low back pain: Secondary | ICD-10-CM

## 2017-02-20 DIAGNOSIS — M5386 Other specified dorsopathies, lumbar region: Secondary | ICD-10-CM

## 2017-02-20 DIAGNOSIS — M542 Cervicalgia: Secondary | ICD-10-CM

## 2017-02-20 DIAGNOSIS — R29898 Other symptoms and signs involving the musculoskeletal system: Secondary | ICD-10-CM
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
## 2017-02-25 ENCOUNTER — Other Ambulatory Visit: Payer: Self-pay

## 2017-02-25 ENCOUNTER — Encounter (HOSPITAL_COMMUNITY): Payer: Self-pay

## 2017-02-25 ENCOUNTER — Ambulatory Visit (HOSPITAL_COMMUNITY): Payer: BLUE CROSS/BLUE SHIELD

## 2017-02-25 DIAGNOSIS — M5386 Other specified dorsopathies, lumbar region: Secondary | ICD-10-CM

## 2017-02-25 DIAGNOSIS — M542 Cervicalgia: Secondary | ICD-10-CM

## 2017-02-25 DIAGNOSIS — M545 Low back pain: Secondary | ICD-10-CM

## 2017-02-25 DIAGNOSIS — R29898 Other symptoms and signs involving the musculoskeletal system: Secondary | ICD-10-CM
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
## 2017-02-26 ENCOUNTER — Encounter: Payer: Self-pay | Admitting: Orthopaedic Surgery

## 2017-02-26 ENCOUNTER — Ambulatory Visit: Payer: BLUE CROSS/BLUE SHIELD | Admitting: Orthopaedic Surgery

## 2017-02-26 VITALS — BP 130/89 | HR 78 | Ht 71.0 in | Wt 184.0 lb

## 2017-02-26 DIAGNOSIS — M549 Dorsalgia, unspecified: Secondary | ICD-10-CM | POA: Diagnosis not present

## 2017-02-26 DIAGNOSIS — M542 Cervicalgia: Secondary | ICD-10-CM

## 2017-02-26 MED ORDER — HYDROCODONE-ACETAMINOPHEN 7.5-325 MG PO TABS: ORAL_TABLET | ORAL | 0 refills | Status: DC

## 2017-02-26 NOTE — Progress Notes (Signed)
Patient Johnny Andrade, male DOB:1975/05/11, 42 y.o. HER:[id number]  Chief Complaint  Patient presents with  . Follow-up    Neck, Back, and Shoulder    HPI  Johnny Andrade is a 42 y.o. male who has continued pain of his neck and lower left back.  His shoulder is much improved on the right and is not hurting him now.  His neck is better but still hurts.  He has been going to PT and they are working more on the left lower back.  He has no paresthesias, no spasms.  He is taking his pain medicine.  He has no weakness, no new trauma. HPI  Body mass index is 25.66 kg/m.  ROS  Review of Systems  HENT: Negative for congestion.   Respiratory: Negative for cough and shortness of breath.   Cardiovascular: Negative for chest pain and leg swelling.  Endocrine: Negative for cold intolerance.  Musculoskeletal: Positive for arthralgias and back pain.  Allergic/Immunologic: Negative for environmental allergies.  All other systems reviewed and are negative.   History reviewed. No pertinent past medical history.  Past Surgical History:  Procedure Laterality Date  . APPENDECTOMY      Family History  Problem Relation Age of Onset  . GER disease Mother     Social History Social History   Tobacco Use  . Smoking status: Never Smoker  . Smokeless tobacco: Never Used  Substance Use Topics  . Alcohol use: No  . Drug use: No    Allergies  Allergen Reactions  . Pollen Extract Other (See Comments)    Eye burning/swelling.    Current Outpatient Medications  Medication Sig Dispense Refill  . cyclobenzaprine (FLEXERIL) 10 MG tablet Take 1 tablet (10 mg total) by mouth 3 (three) times daily. 40 tablet 1  . HYDROcodone-acetaminophen (NORCO) 7.5-325 MG tablet One every four hours for pain as needed.   Must last 14 days. 56 tablet 0  . lidocaine (LIDODERM) 5 % Place 1 patch daily onto the skin. Remove & Discard patch within 12 hours or as directed by MD (Patient not taking: Reported on  01/15/2017) 30 patch 0  . naproxen (NAPROSYN) 500 MG tablet Take 1 tablet (500 mg total) by mouth 2 (two) times daily with a meal. 60 tablet 5   No current facility-administered medications for this visit.      Physical Exam  Blood pressure 130/89, pulse 78, height 5' 11"  (1.803 m), weight 184 lb (83.5 kg).  Constitutional: overall normal hygiene, normal nutrition, well developed, normal grooming, normal body habitus. Assistive device:none  Musculoskeletal: gait and station Limp none, muscle tone and strength are normal, no tremors or atrophy is present.  .  Neurological: coordination overall normal.  Deep tendon reflex/nerve stretch intact.  Sensation normal.  Cranial nerves II-XII intact.   Skin:   Normal overall no scars, lesions, ulcers or rashes. No psoriasis.  Psychiatric: Alert and oriented x 3.  Recent memory intact, remote memory unclear.  Normal mood and affect. Well groomed.  Good eye contact.  Cardiovascular: overall no swelling, no varicosities, no edema bilaterally, normal temperatures of the legs and arms, no clubbing, cyanosis and good capillary refill.  Lymphatic: palpation is normal.  Neck motion is better, he still has some turning to the right but no tightness or spasms.  Spine/Pelvis examination:  Inspection:  Overall, sacoiliac joint benign and hips nontender; without crepitus or defects.   Thoracic spine inspection: Alignment normal without kyphosis present   Lumbar spine inspection:  Alignment  with normal lumbar lordosis, without scoliosis apparent.   Thoracic spine palpation:  without tenderness of spinal processes   Lumbar spine palpation: without tenderness of lumbar area; without tightness of lumbar muscles    Range of Motion:   Lumbar flexion, forward flexion is normal without pain or tenderness He complains of left sided pain but has no tightness.   Lumbar extension is full without pain or tenderness   Left lateral bend is normal without pain  or tenderness   Right lateral bend is normal without pain or tenderness   Straight leg raising is normal  Strength & tone: normal   Stability overall normal stability All other systems reviewed and are negative   The patient has been educated about the nature of the problem(s) and counseled on treatment options.  The patient appeared to understand what I have discussed and is in agreement with it.  Encounter Diagnoses  Name Primary?  . Neck pain Yes  . Mid back pain     PLAN Call if any problems.  Precautions discussed.  Continue current medications.   Return to clinic 3 weeks   I have reviewed the [place] web site prior to prescribing narcotic medicine for this patient.  Electronically Signed Sanjuana Kava, MD 1/24/201910:28 AM

## 2017-02-27 ENCOUNTER — Ambulatory Visit (HOSPITAL_COMMUNITY): Payer: BLUE CROSS/BLUE SHIELD

## 2017-02-27 ENCOUNTER — Encounter (HOSPITAL_COMMUNITY): Payer: Self-pay

## 2017-02-27 DIAGNOSIS — M542 Cervicalgia: Secondary | ICD-10-CM | POA: Diagnosis not present

## 2017-02-27 DIAGNOSIS — R29898 Other symptoms and signs involving the musculoskeletal system: Secondary | ICD-10-CM

## 2017-02-27 DIAGNOSIS — M545 Low back pain: Secondary | ICD-10-CM

## 2017-02-27 DIAGNOSIS — M5386 Other specified dorsopathies, lumbar region: Secondary | ICD-10-CM

## 2017-02-27 NOTE — Therapy (Signed)
Johnny Andrade, [place], [address] Phone: [phone]   Fax:  [phone]  Physical Therapy Treatment  Patient Details  Name: Johnny Andrade MRN: [id number] Date of Birth: 1975/12/16 Referring Provider: Sanjuana Kava, MD   Encounter Date: 02/27/2017  PT End of Session - 02/27/17 1741    Visit Number  11    Number of Visits  17    Date for PT Re-Evaluation  03/13/17    Authorization Type  BCBS Other    Authorization Time Period  01/15/17- 03/13/17    Authorization - Visit Number  11    Authorization - Number of Visits  17    PT Start Time  6967    PT Stop Time  1737    PT Time Calculation (min)  47 min    Activity Tolerance  Patient tolerated treatment well;Patient limited by pain Pain decreased from 8/10 to 6.5/10 at EOS    Behavior During Therapy  [facility] for tasks assessed/performed       History reviewed. No pertinent past medical history.  Past Surgical History:  Procedure Laterality Date  . APPENDECTOMY      There were no vitals filed for this visit.  Subjective Assessment - 02/27/17 1653    Subjective  Pt stated work went okay today.  Continues to c/o LBP Rt>Lt pain scale 8/10.  Has tried the child's pose at home however it is a little difficult    Patient Stated Goals  Patient would like to have less pain at work     Currently in Pain?  Yes    Pain Score  8     Pain Location  Back    Pain Orientation  Lower    Pain Descriptors / Indicators  Aching;Sharp    Pain Type  Chronic pain    Pain Onset  More than a month ago    Pain Frequency  Constant    Aggravating Factors   walking, lifting, twisting, transitional movements    Pain Relieving Factors  heat and medicine    Effect of Pain on Daily Activities  moderate                      [facility] Adult PT Treatment/Exercise - 02/27/17 0001      Lumbar Exercises: Stretches   Active Hamstring Stretch  2 reps;30 seconds seated with 12in step       Lumbar Exercises: Seated   Other Seated Lumbar Exercises  lumbar strech/opener with forard trunk lean on swiss ball       Lumbar Exercises: Prone   Other Prone Lumbar Exercises  Child's pose stretch: 10x 10 seconds holds      Manual Therapy   Manual Therapy  Myofascial release;Soft tissue mobilization    Manual therapy comments  Manual complete separate than rest of tx    Soft tissue mobilization  STM to QL, paraspinals and lats prone position with pillow under hips    Myofascial Release  Superficial gliding for myofascial release performed along right/left lower back (quadratus lumborum); along paraspinals. Noted improved tissue mobility of left paraspinals with decreased bulk and tone at end of manual therapy.               PT Short Term Goals - 02/12/17 1648      PT SHORT TERM GOAL #1   Title  Patient will report understanding and regular compliance with HEP.     Baseline  02/04/2017: Daily; 02/12/17 - 2-3 times per day    Status  Achieved      PT SHORT TERM GOAL #2   Title  Patient will report decreased maximum pain from 10/10 pain to 7/10 pain over a 1 week period.     Baseline  02/04/17:  Pain scale ranges from 8-10/10 on Rt neck; 02/12/17 - 7-9/10 at worst    Status  Partially Met      PT SHORT TERM GOAL #3   Title  Patient will demonstrate improved cervical muscular endurance maintaining cervical flexion for 10 seconds in supine.     Baseline  01/02: Pt able to maintain cervical flexion for 7 seconds, limited by pain.  02/12/17 - 10 seconds 7 seconds 2nd trial    Status  Achieved        PT Long Term Goals - 02/12/17 1648      PT LONG TERM GOAL #1   Title  Patient will report maximum pain of 2/10 over a 1 week period while at work to demonstrate decreased pain and increased activity tolerance.     Baseline  Patient is experiencing max of 10/10 pain. 02/12/17 - patients pain is 7-9/10 and exacerbated at work    Time  8    Period  Weeks    Status  On-going     Target Date  03/12/17      PT LONG TERM GOAL #2   Title  Patient will demonstrate ability to maintain cervical flexion for 20 seconds demonstrating improved cervical flexion endurance.     Baseline  Patient maintains cervical flexion in supine for 5 seconds    Time  8    Period  Weeks    Status  On-going    Target Date  03/12/17      PT LONG TERM GOAL #3   Title  Patient will demonstrate improved cervical flexion, extension, and rotation active range of motion of 10 degrees with only 2/10 pain maximum with each movement.    Baseline  Eval: 35, extension: 50, and rotation right: 35, rotation left 40 degrees. 02/12/17 - L: rotaion- 45*, sdiebend - 40*; R: rotation - 24*, sidebend 44*; flexion - 28*, extension - 30*    Time  8    Period  Weeks    Status  On-going      PT LONG TERM GOAL #4   Title  Patient will have an increased FOTO score to 40% limitation indicating improved ability to perform functional activities.     Baseline  Patient's FOTO score is at 61% limitation. 02/12/17 - 64% limited    Status  On-going            Plan - 02/27/17 1741    Clinical Impression Statement  Pt continues to be limited by pain with very slow and guarded movements.  Continued with mobility exercises with additional seated hamstring stretch to improve seated posture.  Educated pt on importance of improving posture to reduce stress on lumbar musclature.  He remains limited by myofascial restriciotns along spine and hypertonic paraspinals throughout back.  Responded well to MFR with light soft tissue mobilization.  EOS pt reports decrease in pain from 8/10 to 6.5/10.  Encouraged to continue stretches and hydration.    Rehab Potential  Good    Clinical Impairments Affecting Rehab Potential  Impairments in strength, ROM, and pain primarily    PT Frequency  2x / week    PT Duration  8 weeks  PT Treatment/Interventions  ADLs/Self Care Home Management;Cryotherapy;Electrical Stimulation;Moist Heat;Gait  training;Stair training;Functional mobility training;Therapeutic activities;Therapeutic exercise;Balance training;Neuromuscular re-education;Patient/family education;Manual techniques;Passive range of motion    PT Next Visit Plan  Continue with myofascial release of paraspinal along cervical/thoracic/lumbar spine. And Conitnue with seated lumbar stretch with swiss ball roll out. If with PT perform PA's grade I to spinal joints for pain relief. Gatch table to position patient in lumbar flexion and cervical felxion for comfort. Continue with cervical/chin tucks and initiate TA/core exercises, and ask patient repsonse to child's pose stretch. If patient still complaining of ulnar nerve symptoms perform ULTT3 test and determine if glides are appropriate.    PT Home Exercise Plan  TA activation 5 seconds x10 2x/day; Supine chin tucks 3 second holds x15 1x/day; isometric; UT and hamstring stretches 2x 30"; 02/18/17 - childs pose       Patient will benefit from skilled therapeutic intervention in order to improve the following deficits and impairments:  Impaired sensation, Improper body mechanics, Pain, Decreased mobility, Decreased activity tolerance, Decreased endurance, Decreased range of motion, Decreased strength, Hypomobility, Difficulty walking, Impaired flexibility  Visit Diagnosis: Neck pain  Midline low back pain, unspecified chronicity, with sciatica presence unspecified  Decreased range of motion of neck  Decreased range of motion of lumbar spine     Problem List Patient Active Problem List   Diagnosis Date Noted  . CONTUSION, BACK 04/16/2009  . CONTUSION OF HIP 04/16/2009  . CONTUSION, KNEE 04/16/2009   Ihor Austin, [place]; [place]  Aldona Lento 02/27/2017, 5:45 PM  [facility] [address] [place], [place], [address] Phone: [phone]   Fax:  [phone]  Name: Johnny Andrade MRN: [id number] Date of Birth:  1975-02-11

## 2017-03-03 ENCOUNTER — Encounter (HOSPITAL_COMMUNITY): Payer: Self-pay

## 2017-03-03 ENCOUNTER — Ambulatory Visit (HOSPITAL_COMMUNITY): Payer: BLUE CROSS/BLUE SHIELD

## 2017-03-03 DIAGNOSIS — M542 Cervicalgia: Secondary | ICD-10-CM | POA: Diagnosis not present

## 2017-03-03 DIAGNOSIS — M545 Low back pain: Secondary | ICD-10-CM

## 2017-03-03 DIAGNOSIS — M5386 Other specified dorsopathies, lumbar region: Secondary | ICD-10-CM

## 2017-03-03 DIAGNOSIS — R29898 Other symptoms and signs involving the musculoskeletal system: Secondary | ICD-10-CM

## 2017-03-03 NOTE — Therapy (Signed)
[place], [place], [address] Phone: [phone]   Fax:  [phone]  Physical Therapy Treatment  Patient Details  Name: Johnny Andrade MRN: [id number] Date of Birth: [birth year] Referring Provider: Sanjuana Kava, MD   Encounter Date: 03/03/2017  PT End of Session - 03/03/17 1608    Visit Number  12    Number of Visits  17    Authorization Type  BCBS Other    Authorization Time Period  01/15/17- 03/13/17    Authorization - Visit Number  12    Authorization - Number of Visits  17    PT Start Time  1600    PT Stop Time  4665    PT Time Calculation (min)  44 min    Activity Tolerance  Patient tolerated treatment well;Patient limited by pain    Behavior During Therapy  [facility] for tasks assessed/performed       History reviewed. No pertinent past medical history.  Past Surgical History:  Procedure Laterality Date  . APPENDECTOMY      There were no vitals filed for this visit.  Subjective Assessment - 03/03/17 1604    Subjective  LBP pain scale 8/10.  Stated the weather has played a part in increased pain today.    Patient Stated Goals  Patient would like to have less pain at work     Currently in Pain?  Yes    Pain Score  8     Pain Location  Back    Pain Orientation  Lower    Pain Descriptors / Indicators  Aching;Sharp    Pain Type  Chronic pain    Pain Onset  More than a month ago    Pain Frequency  Intermittent    Aggravating Factors   walking, lifting, twisting, transitional movements    Pain Relieving Factors  heat and medicine    Effect of Pain on Daily Activities  moderate                      [facility] Adult PT Treatment/Exercise - 03/03/17 0001      Lumbar Exercises: Stretches   Active Hamstring Stretch  3 reps;30 seconds seated with 12in step BUE reaching      Lumbar Exercises: Seated   Other Seated Lumbar Exercises  lumbar strech/opener with forard trunk lean on swiss ball       Manual Therapy   Manual Therapy  Myofascial release    Myofascial Release  Superficial gliding for myofascial release performed along right/left lower back (quadratus lumborum); along paraspinals. Noted improved tissue mobility of left paraspinals with decreased bulk and tone at end of manual therapy.               PT Short Term Goals - 02/12/17 1648      PT SHORT TERM GOAL #1   Title  Patient will report understanding and regular compliance with HEP.     Baseline  02/04/2017: Daily; 02/12/17 - 2-3 times per day    Status  Achieved      PT SHORT TERM GOAL #2   Title  Patient will report decreased maximum pain from 10/10 pain to 7/10 pain over a 1 week period.     Baseline  02/04/17:  Pain scale ranges from 8-10/10 on Rt neck; 02/12/17 - 7-9/10 at worst    Status  Partially Met      PT SHORT TERM GOAL #3   Title  Patient will demonstrate improved cervical muscular endurance maintaining cervical flexion for 10 seconds in supine.     Baseline  01/02: Pt able to maintain cervical flexion for 7 seconds, limited by pain.  02/12/17 - 10 seconds 7 seconds 2nd trial    Status  Achieved        PT Long Term Goals - 02/12/17 1648      PT LONG TERM GOAL #1   Title  Patient will report maximum pain of 2/10 over a 1 week period while at work to demonstrate decreased pain and increased activity tolerance.     Baseline  Patient is experiencing max of 10/10 pain. 02/12/17 - patients pain is 7-9/10 and exacerbated at work    Time  8    Period  Weeks    Status  On-going    Target Date  03/12/17      PT LONG TERM GOAL #2   Title  Patient will demonstrate ability to maintain cervical flexion for 20 seconds demonstrating improved cervical flexion endurance.     Baseline  Patient maintains cervical flexion in supine for 5 seconds    Time  8    Period  Weeks    Status  On-going    Target Date  03/12/17      PT LONG TERM GOAL #3   Title  Patient will demonstrate improved cervical flexion,  extension, and rotation active range of motion of 10 degrees with only 2/10 pain maximum with each movement.    Baseline  Eval: 35, extension: 50, and rotation right: 35, rotation left 40 degrees. 02/12/17 - L: rotaion- 45*, sdiebend - 40*; R: rotation - 24*, sidebend 44*; flexion - 28*, extension - 30*    Time  8    Period  Weeks    Status  On-going      PT LONG TERM GOAL #4   Title  Patient will have an increased FOTO score to 40% limitation indicating improved ability to perform functional activities.     Baseline  Patient's FOTO score is at 61% limitation. 02/12/17 - 64% limited    Status  On-going            Plan - 03/03/17 1645    Clinical Impression Statement  Pt continues to be limited by pain and hypertonic myofascial restrictions along paraspinals musculature.  Increased focus with manual technqiues this session to address those restrictions.  Pt did present with improved seated posture, does report he has added seated hamstring stretch to HEP with positive results.      Rehab Potential  Good    Clinical Impairments Affecting Rehab Potential  Impairments in strength, ROM, and pain primarily    PT Frequency  2x / week    PT Duration  8 weeks    PT Treatment/Interventions  ADLs/Self Care Home Management;Cryotherapy;Electrical Stimulation;Moist Heat;Gait training;Stair training;Functional mobility training;Therapeutic activities;Therapeutic exercise;Balance training;Neuromuscular re-education;Patient/family education;Manual techniques;Passive range of motion    PT Next Visit Plan  Continue with myofascial release of paraspinal along cervical/thoracic/lumbar spine. And Conitnue with seated lumbar stretch with swiss ball roll out. If with PT perform PA's grade I to spinal joints for pain relief. Gatch table to position patient in lumbar flexion and cervical felxion for comfort. Continue with cervical/chin tucks and initiate TA/core exercises, and ask patient repsonse to child's pose  stretch. If patient still complaining of ulnar nerve symptoms perform ULTT3 test and determine if glides are appropriate.    PT Home Exercise Plan  TA activation  5 seconds x10 2x/day; Supine chin tucks 3 second holds x15 1x/day; isometric; UT and hamstring stretches 2x 30"; 02/18/17 - childs pose       Patient will benefit from skilled therapeutic intervention in order to improve the following deficits and impairments:  Impaired sensation, Improper body mechanics, Pain, Decreased mobility, Decreased activity tolerance, Decreased endurance, Decreased range of motion, Decreased strength, Hypomobility, Difficulty walking, Impaired flexibility  Visit Diagnosis: Neck pain  Midline low back pain, unspecified chronicity, with sciatica presence unspecified  Decreased range of motion of neck  Decreased range of motion of lumbar spine     Problem List Patient Active Problem List   Diagnosis Date Noted  . CONTUSION, BACK 04/16/2009  . CONTUSION OF HIP 04/16/2009  . CONTUSION, KNEE 04/16/2009   Ihor Austin, [place]; [place]  Aldona Lento 03/03/2017, 4:57 PM  [facility] [address] [place], [place], [address] Phone: [phone]   Fax:  [phone]  Name: ROYE GUSTAFSON MRN: [id number] Date of Birth: [birth year]

## 2017-03-04 ENCOUNTER — Ambulatory Visit (HOSPITAL_COMMUNITY): Payer: BLUE CROSS/BLUE SHIELD

## 2017-03-04 ENCOUNTER — Other Ambulatory Visit: Payer: Self-pay

## 2017-03-04 ENCOUNTER — Encounter (HOSPITAL_COMMUNITY): Payer: Self-pay

## 2017-03-04 DIAGNOSIS — R29898 Other symptoms and signs involving the musculoskeletal system: Secondary | ICD-10-CM

## 2017-03-04 DIAGNOSIS — M542 Cervicalgia: Secondary | ICD-10-CM | POA: Diagnosis not present

## 2017-03-04 DIAGNOSIS — M545 Low back pain: Secondary | ICD-10-CM

## 2017-03-04 DIAGNOSIS — M5386 Other specified dorsopathies, lumbar region: Secondary | ICD-10-CM

## 2017-03-04 NOTE — Patient Instructions (Signed)
   Hamstring Stretch Seated w/Stool: 3 times 30 seconds both legs  Sit on edge of seat with leg that you are stretching straight out with foot on stool. Keeping back straight and leg straight bend forward at the hip until a stretch is felt in the back of leg and hold.
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
## 2017-03-11 ENCOUNTER — Telehealth (HOSPITAL_COMMUNITY): Payer: Self-pay | Admitting: General Practice

## 2017-03-11 ENCOUNTER — Ambulatory Visit (HOSPITAL_COMMUNITY): Payer: BLUE CROSS/BLUE SHIELD

## 2017-03-11 ENCOUNTER — Telehealth (HOSPITAL_COMMUNITY): Payer: Self-pay

## 2017-03-11 NOTE — Telephone Encounter (Signed)
I called Johnny Andrade at his mobile phone number on file to offer him an earlier appointment slot for today. He answered and stated he just woke up and that he took medicine for a cold because he is feeling under the weather, he was planning to call and reschedule since he didn't want to come in sick. I told him that would be fine and I transferred him to the front desk to set up another appointment.   Kipp Brood, PT, DPT Physical Therapist with [facility]  03/11/2017 3:24 PM

## 2017-03-11 NOTE — Telephone Encounter (Signed)
03/11/17  Pt cx because  He said he wasn't feeling well

## 2017-03-18 ENCOUNTER — Ambulatory Visit (HOSPITAL_COMMUNITY): Payer: BLUE CROSS/BLUE SHIELD | Attending: Orthopaedic Surgery

## 2017-03-18 ENCOUNTER — Other Ambulatory Visit: Payer: Self-pay

## 2017-03-18 ENCOUNTER — Encounter (HOSPITAL_COMMUNITY): Payer: Self-pay

## 2017-03-18 DIAGNOSIS — M542 Cervicalgia: Secondary | ICD-10-CM

## 2017-03-18 DIAGNOSIS — M545 Low back pain: Secondary | ICD-10-CM | POA: Insufficient documentation

## 2017-03-18 DIAGNOSIS — R29898 Other symptoms and signs involving the musculoskeletal system: Secondary | ICD-10-CM | POA: Diagnosis present

## 2017-03-18 DIAGNOSIS — M5386 Other specified dorsopathies, lumbar region: Secondary | ICD-10-CM
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
## 2017-03-19 ENCOUNTER — Encounter: Payer: Self-pay | Admitting: Orthopaedic Surgery

## 2017-03-19 ENCOUNTER — Encounter: Payer: Self-pay | Admitting: Radiology

## 2017-03-19 ENCOUNTER — Ambulatory Visit (INDEPENDENT_AMBULATORY_CARE_PROVIDER_SITE_OTHER): Payer: BLUE CROSS/BLUE SHIELD

## 2017-03-19 ENCOUNTER — Ambulatory Visit: Payer: BLUE CROSS/BLUE SHIELD | Admitting: Orthopaedic Surgery

## 2017-03-19 VITALS — BP 137/89 | HR 62 | Temp 98.1°F | Ht 71.0 in | Wt 185.0 lb

## 2017-03-19 DIAGNOSIS — M5442 Lumbago with sciatica, left side: Secondary | ICD-10-CM | POA: Diagnosis not present

## 2017-03-19 DIAGNOSIS — G8929 Other chronic pain: Secondary | ICD-10-CM

## 2017-03-19 MED ORDER — HYDROCODONE-ACETAMINOPHEN 7.5-325 MG PO TABS: ORAL_TABLET | ORAL | 0 refills | Status: DC

## 2017-03-19 MED ORDER — CYCLOBENZAPRINE HCL 10 MG PO TABS: 10.0000 mg | ORAL_TABLET | Freq: Three times a day (TID) | ORAL | 1 refills | Status: DC

## 2017-03-19 NOTE — Progress Notes (Signed)
Patient Johnny Andrade, male DOB:September 22, 1975, 42 y.o. QZE:[id number]  Chief Complaint  Patient presents with  . Follow-up    Recheck on back pain    HPI  Johnny Andrade is a 42 y.o. male who has continued pain of the lower back and his neck.  He has been going to PT and is making progress, although slowly.  His lower back is more painful with left sided sciatica.  His back is not improving that much. I will get MRI of the lumbar spine.  I have reviewed the PT notes. HPI  Body mass index is 25.8 kg/m.  ROS  Review of Systems  HENT: Negative for congestion.   Respiratory: Negative for cough and shortness of breath.   Cardiovascular: Negative for chest pain and leg swelling.  Endocrine: Negative for cold intolerance.  Musculoskeletal: Positive for arthralgias, back pain and neck pain.  Allergic/Immunologic: Negative for environmental allergies.  All other systems reviewed and are negative.   History reviewed. No pertinent past medical history.  Past Surgical History:  Procedure Laterality Date  . APPENDECTOMY      Family History  Problem Relation Age of Onset  . GER disease Mother     Social History Social History   Tobacco Use  . Smoking status: Never Smoker  . Smokeless tobacco: Never Used  Substance Use Topics  . Alcohol use: No  . Drug use: No    Allergies  Allergen Reactions  . Pollen Extract Other (See Comments)    Eye burning/swelling.    Current Outpatient Medications  Medication Sig Dispense Refill  . cyclobenzaprine (FLEXERIL) 10 MG tablet Take 1 tablet (10 mg total) by mouth 3 (three) times daily. 40 tablet 1  . HYDROcodone-acetaminophen (NORCO) 7.5-325 MG tablet One every four hours for pain as needed.   Must last 14 days. 56 tablet 0  . lidocaine (LIDODERM) 5 % Place 1 patch daily onto the skin. Remove & Discard patch within 12 hours or as directed by MD (Patient not taking: Reported on 01/15/2017) 30 patch 0  . naproxen (NAPROSYN) 500 MG  tablet Take 1 tablet (500 mg total) by mouth 2 (two) times daily with a meal. 60 tablet 5   No current facility-administered medications for this visit.      Physical Exam  Blood pressure 137/89, pulse 62, temperature 98.1 F (36.7 C), height 5' 11"  (1.803 m), weight 185 lb (83.9 kg).  Constitutional: overall normal hygiene, normal nutrition, well developed, normal grooming, normal body habitus. Assistive device:none  Musculoskeletal: gait and station Limp none, muscle tone and strength are normal, no tremors or atrophy is present.  .  Neurological: coordination overall normal.  Deep tendon reflex/nerve stretch intact.  Sensation normal.  Cranial nerves II-XII intact.   Skin:   Normal overall no scars, lesions, ulcers or rashes. No psoriasis.  Psychiatric: Alert and oriented x 3.  Recent memory intact, remote memory unclear.  Normal mood and affect. Well groomed.  Good eye contact.  Cardiovascular: overall no swelling, no varicosities, no edema bilaterally, normal temperatures of the legs and arms, no clubbing, cyanosis and good capillary refill.  Lymphatic: palpation is normal.  Neck is tender, ROM 10 to the left, 15 to the right, no spasm.    Spine/Pelvis examination:  Inspection:  Overall, sacoiliac joint benign and hips nontender; without crepitus or defects.   Thoracic spine inspection: Alignment normal without kyphosis present   Lumbar spine inspection:  Alignment  with normal lumbar lordosis, without scoliosis apparent.  Thoracic spine palpation:  with tenderness of spinal processes   Lumbar spine palpation: with tenderness of lumbar area; with tightness of lumbar muscles    Range of Motion:   Lumbar flexion, forward flexion is 25 with pain or tenderness    Lumbar extension is 5 with pain or tenderness   Left lateral bend is Normal  with pain or tenderness   Right lateral bend is Normal with pain or tenderness   Straight leg raising is Abnormal- at 30 degrees  left   Strength & tone: Normal   Stability overall normal stability    All other systems reviewed and are negative   X-rays of the lumbar spine were done, reported separately.  The patient has been educated about the nature of the problem(s) and counseled on treatment options.  The patient appeared to understand what I have discussed and is in agreement with it.  Encounter Diagnosis  Name Primary?  . Chronic left-sided low back pain with left-sided sciatica Yes    PLAN Call if any problems.  Precautions discussed.  Continue current medications.   Return to clinic get MRI of the lumbar spine.  Out of work.  I have reviewed the [place] web site prior to prescribing narcotic medicine for this patient.  Electronically Signed Sanjuana Kava, MD 2/14/20199:55 AM

## 2017-03-19 NOTE — Patient Instructions (Signed)
Out of work next two weeks.  To try to get MRI soon.

## 2017-03-24 ENCOUNTER — Telehealth: Payer: Self-pay | Admitting: Orthopaedic Surgery

## 2017-03-24 NOTE — Telephone Encounter (Signed)
Patient called regarding status of MRI - said was to call back if he had not heard. Also mentioned that he hasn't re-started therapy, as understood he was to wait till after MRI. Ph#'s [phone] or [phone].

## 2017-03-24 NOTE — Telephone Encounter (Signed)
I spoke to patient advised him to call [facility] for the appointment for the MRI and to call us back to make follow up with Dr Luna Glasgow  He was also given the number to call and schedule the physical therapy   He voiced understanding.

## 2017-03-24 NOTE — Telephone Encounter (Signed)
I called both numbers and was unable to reach him.  I left a message on the second number.  Johnny Andrade called and spoke with someone who will give him a message to call us.  We need to give him the numbers for [facility] [phone] and [facility] [phone]. Both places have been trying to call him.  He needs to call them.

## 2017-03-28 ENCOUNTER — Ambulatory Visit
Admission: RE | Admit: 2017-03-28 | Discharge: 2017-03-28 | Disposition: A | Discharge status: Home / Self Care | Payer: BLUE CROSS/BLUE SHIELD | Source: Ambulatory Visit | Attending: Orthopaedic Surgery | Admitting: Orthopaedic Surgery

## 2017-03-28 DIAGNOSIS — M5442 Lumbago with sciatica, left side: Principal | ICD-10-CM

## 2017-03-28 DIAGNOSIS — G8929 Other chronic pain: Secondary | ICD-10-CM

## 2017-03-31 ENCOUNTER — Ambulatory Visit: Payer: BLUE CROSS/BLUE SHIELD | Admitting: Orthopaedic Surgery

## 2017-03-31 ENCOUNTER — Encounter: Payer: Self-pay | Admitting: Orthopaedic Surgery

## 2017-03-31 VITALS — BP 126/81 | HR 72 | Ht 72.0 in | Wt 182.0 lb

## 2017-03-31 DIAGNOSIS — G8929 Other chronic pain: Secondary | ICD-10-CM

## 2017-03-31 DIAGNOSIS — M5442 Lumbago with sciatica, left side: Secondary | ICD-10-CM | POA: Diagnosis not present

## 2017-03-31 NOTE — Patient Instructions (Signed)
Out of work

## 2017-03-31 NOTE — Progress Notes (Signed)
Patient Johnny Andrade, male DOB:1975-02-29, 42 y.o. FMB:[id number]  Chief Complaint  Patient presents with  . Back Pain    review MRI scan    HPI  Johnny Andrade is a 42 y.o. male who has continued pain of the lower back.  He had MRI which showed: IMPRESSION: 1. No fracture or malalignment.  No acute osseous process. 2. 3 x 3 x 6 mm L4-5 disc extrusion. 3. Degenerative change of lower lumbar spine. L4-5 and L5-S1 annular fissures. 4. No canal stenosis.  Mild RIGHT L5-S1 neural foraminal narrowing.  I will have him go and be evaluated for epidural of the lumbar spine.  He is to remain out of work. HPI  Body mass index is 24.68 kg/m.  ROS  Review of Systems  HENT: Negative for congestion.   Respiratory: Negative for cough and shortness of breath.   Cardiovascular: Negative for chest pain and leg swelling.  Endocrine: Negative for cold intolerance.  Musculoskeletal: Positive for arthralgias, back pain and neck pain.  Allergic/Immunologic: Negative for environmental allergies.  All other systems reviewed and are negative.   History reviewed. No pertinent past medical history.  Past Surgical History:  Procedure Laterality Date  . APPENDECTOMY      Family History  Problem Relation Age of Onset  . GER disease Mother     Social History Social History   Tobacco Use  . Smoking status: Never Smoker  . Smokeless tobacco: Never Used  Substance Use Topics  . Alcohol use: No  . Drug use: No    Allergies  Allergen Reactions  . Pollen Extract Other (See Comments)    Eye burning/swelling.    Current Outpatient Medications  Medication Sig Dispense Refill  . cyclobenzaprine (FLEXERIL) 10 MG tablet Take 1 tablet (10 mg total) by mouth 3 (three) times daily. 40 tablet 1  . HYDROcodone-acetaminophen (NORCO) 7.5-325 MG tablet One every four hours for pain as needed.   Must last 14 days. 56 tablet 0  . lidocaine (LIDODERM) 5 % Place 1 patch daily onto the skin. Remove &  Discard patch within 12 hours or as directed by MD (Patient not taking: Reported on 01/15/2017) 30 patch 0  . naproxen (NAPROSYN) 500 MG tablet Take 1 tablet (500 mg total) by mouth 2 (two) times daily with a meal. 60 tablet 5   No current facility-administered medications for this visit.      Physical Exam  Blood pressure 126/81, pulse 72, height 6' (1.829 m), weight 182 lb (82.6 kg).  Constitutional: overall normal hygiene, normal nutrition, well developed, normal grooming, normal body habitus. Assistive device:none  Musculoskeletal: gait and station Limp none, muscle tone and strength are normal, no tremors or atrophy is present.  .  Neurological: coordination overall normal.  Deep tendon reflex/nerve stretch intact.  Sensation normal.  Cranial nerves II-XII intact.   Skin:   Normal overall no scars, lesions, ulcers or rashes. No psoriasis.  Psychiatric: Alert and oriented x 3.  Recent memory intact, remote memory unclear.  Normal mood and affect. Well groomed.  Good eye contact.  Cardiovascular: overall no swelling, no varicosities, no edema bilaterally, normal temperatures of the legs and arms, no clubbing, cyanosis and good capillary refill.  Lymphatic: palpation is normal.  Spine/Pelvis examination:  Inspection:  Overall, sacoiliac joint benign and hips nontender; without crepitus or defects.   Thoracic spine inspection: Alignment normal without kyphosis present   Lumbar spine inspection:  Alignment  with normal lumbar lordosis, without scoliosis apparent.  Thoracic spine palpation:  without tenderness of spinal processes   Lumbar spine palpation: without tenderness of lumbar area; without tightness of lumbar muscles    Range of Motion:   Lumbar flexion, forward flexion is normal without pain or tenderness    Lumbar extension is full without pain or tenderness   Left lateral bend is normal without pain or tenderness   Right lateral bend is normal without pain or  tenderness   Straight leg raising is normal  Strength & tone: normal   Stability overall normal stability. All other systems reviewed and are negative   The patient has been educated about the nature of the problem(s) and counseled on treatment options.  The patient appeared to understand what I have discussed and is in agreement with it.  Encounter Diagnosis  Name Primary?  . Chronic left-sided low back pain with left-sided sciatica Yes    PLAN Call if any problems.  Precautions discussed.  Continue current medications.   Return to clinic for epidurals   Electronically Signed Sanjuana Kava, MD 2/26/20198:42 AM

## 2017-04-02 ENCOUNTER — Encounter: Payer: Self-pay | Admitting: Orthopaedic Surgery

## 2017-04-02 ENCOUNTER — Telehealth: Payer: Self-pay | Admitting: Orthopaedic Surgery

## 2017-04-02 MED ORDER — HYDROCODONE-ACETAMINOPHEN 7.5-325 MG PO TABS: ORAL_TABLET | ORAL | 0 refills | Status: DC

## 2017-04-02 NOTE — Telephone Encounter (Signed)
Patient called for refill: HYDROcodone-acetaminophen (NORCO) 7.5-325 MG tablet Pharmacy is [facility], [place]

## 2017-04-21 ENCOUNTER — Telehealth: Payer: Self-pay | Admitting: Orthopaedic Surgery

## 2017-04-21 NOTE — Telephone Encounter (Signed)
Called patient and scheduled. Relayed I would have been glad to do that initially.  Done, and aware of appointment.

## 2017-04-21 NOTE — Telephone Encounter (Signed)
Patient called (on 04/20/17) asking to speak with Dr Luna Glasgow or Dr Aline Brochure. Would not provide any further information. Appears to have been referred by Dr Luna Glasgow to physiatrist.  Please advise patient. Patient gave phone# [phone].

## 2017-04-21 NOTE — Telephone Encounter (Signed)
He wants to make appointment to follow up for his back. Will you call him to schedule?

## 2017-04-26 ENCOUNTER — Other Ambulatory Visit: Payer: Self-pay | Admitting: Orthopaedic Surgery

## 2017-04-30 ENCOUNTER — Encounter: Payer: Self-pay | Admitting: Orthopedic Surgery

## 2017-04-30 ENCOUNTER — Ambulatory Visit: Payer: BLUE CROSS/BLUE SHIELD | Admitting: Orthopedic Surgery

## 2017-04-30 VITALS — BP 150/89 | HR 90 | Ht 71.0 in | Wt 187.0 lb

## 2017-04-30 DIAGNOSIS — G8929 Other chronic pain: Secondary | ICD-10-CM | POA: Diagnosis not present

## 2017-04-30 DIAGNOSIS — M5442 Lumbago with sciatica, left side: Secondary | ICD-10-CM | POA: Diagnosis not present

## 2017-04-30 MED ORDER — CYCLOBENZAPRINE HCL 10 MG PO TABS: 10.0000 mg | ORAL_TABLET | Freq: Three times a day (TID) | ORAL | 1 refills | Status: AC

## 2017-04-30 MED ORDER — NAPROXEN 500 MG PO TABS: 500.0000 mg | ORAL_TABLET | Freq: Two times a day (BID) | ORAL | 5 refills | Status: AC

## 2017-04-30 MED ORDER — HYDROCODONE-ACETAMINOPHEN 5-325 MG PO TABS: 1.0000 | ORAL_TABLET | Freq: Four times a day (QID) | ORAL | 0 refills | Status: DC | PRN

## 2017-04-30 NOTE — Progress Notes (Signed)
Progress Note   Patient ID: Johnny Andrade, male   DOB: Nov 10, 1975, 42 y.o.   MRN: [id number]  Chief Complaint  Patient presents with  . Back Pain    low back feels better has declined ESI's     Dr. Luna Glasgow follow-up patient  History of back pain did not want to do the ESI's tried exercise ibuprofen and cyclobenzaprine hydrocodone did well was able to return to work back feels better no leg pain    Review of Systems  Constitutional: Negative.   Gastrointestinal: Negative.   Genitourinary: Negative.   Neurological: Negative.    Current Meds  Medication Sig  . cyclobenzaprine (FLEXERIL) 10 MG tablet Take 1 tablet (10 mg total) by mouth 3 (three) times daily.  Marland Kitchen HYDROcodone-acetaminophen (NORCO) 7.5-325 MG tablet One every four hours for pain as needed.   Must last 14 days.  . naproxen (NAPROSYN) 500 MG tablet Take 1 tablet (500 mg total) by mouth 2 (two) times daily with a meal.    Allergies  Allergen Reactions  . Pollen Extract Other (See Comments)    Eye burning/swelling.     BP (!) 150/89   Pulse 90   Ht 5' 11"  (1.803 m)   Wt 187 lb (84.8 kg)   BMI 26.08 kg/m   Physical Exam  Constitutional: He is oriented to person, place, and time. He appears well-developed and well-nourished.  Vital signs have been reviewed and are stable. Gen. appearance the patient is well-developed and well-nourished with normal grooming and hygiene.   Musculoskeletal:       Legs: Neurological: He is alert and oriented to person, place, and time.  Skin: Skin is warm and dry. No erythema.  Psychiatric: He has a normal mood and affect.  Vitals reviewed.    Medical decision-making Encounter Diagnosis  Name Primary?  . Chronic left-sided low back pain with left-sided sciatica Yes    He will continue with ibuprofen and cyclobenzaprine hydrocodone for pain he was see Dr. Luna Glasgow in 6 months mainly because he is on the hydrocodone he will call if his back gets any worse  No orders of the  defined types were placed in this encounter.    Arther Abbott, MD 04/30/2017 3:45 PM

## 2017-05-25 ENCOUNTER — Telehealth: Payer: Self-pay | Admitting: Orthopedic Surgery

## 2017-05-25 NOTE — Telephone Encounter (Signed)
Patient requests refill on Hydrocodone/Acetaminophyen 5-325  Mgs.  Qty  30        Sig: Take 1 tablet by mouth every 6 (six) hours as needed for moderate pain.    Patient states he uses [facility] in [place]

## 2017-05-26 ENCOUNTER — Other Ambulatory Visit: Payer: Self-pay | Admitting: Orthopedic Surgery

## 2017-05-26 DIAGNOSIS — M5442 Lumbago with sciatica, left side: Principal | ICD-10-CM

## 2017-05-26 DIAGNOSIS — G8929 Other chronic pain: Secondary | ICD-10-CM

## 2017-05-26 MED ORDER — HYDROCODONE-ACETAMINOPHEN 5-325 MG PO TABS: 1.0000 | ORAL_TABLET | Freq: Four times a day (QID) | ORAL | 0 refills | Status: AC | PRN

## 2017-06-11 ENCOUNTER — Ambulatory Visit: Payer: BLUE CROSS/BLUE SHIELD | Admitting: Orthopedic Surgery

## 2017-06-11 ENCOUNTER — Encounter: Payer: Self-pay | Admitting: Orthopedic Surgery

## 2017-06-11 VITALS — BP 133/90 | HR 80 | Ht 71.0 in | Wt 187.0 lb

## 2017-06-11 DIAGNOSIS — M5442 Lumbago with sciatica, left side: Secondary | ICD-10-CM

## 2017-06-11 DIAGNOSIS — G8929 Other chronic pain: Secondary | ICD-10-CM

## 2017-06-11 NOTE — Patient Instructions (Signed)
Write a release note for Mr Johnny Andrade

## 2017-06-11 NOTE — Progress Notes (Signed)
Progress Note   Patient ID: Johnny Andrade, male   DOB: September 20, 1975, 42 y.o.   MRN: [id number]  Chief Complaint  Patient presents with  . Follow-up    Recheck on back pain.     Medical decision-making Encounter Diagnosis  Name Primary?  . Chronic left-sided low back pain with left-sided sciatica Yes    PLAN: He has made complete recovery and is doing well, routine follow-up as needed normal activity    Chief Complaint  Patient presents with  . Follow-up    Recheck on back pain.    42 year old male with history of lower back pain treated conservatively with medication and he has returned back to normal in terms of pain requiring no medications for pain relief.    ROS No outpatient medications have been marked as taking for the 06/11/17 encounter (Office Visit) with Carole Civil, MD.    Allergies  Allergen Reactions  . Pollen Extract Other (See Comments)    Eye burning/swelling.     BP 133/90   Pulse 80   Ht 5' 11"  (1.803 m)   Wt 187 lb (84.8 kg)   BMI 26.08 kg/m   Physical Exam  Musculoskeletal:       Arms:      Arther Abbott, MD 06/11/2017 12:01 PM

## 2017-06-12 ENCOUNTER — Ambulatory Visit: Payer: BLUE CROSS/BLUE SHIELD | Admitting: Orthopedic Surgery

## 2018-04-20 ENCOUNTER — Telehealth: Payer: Self-pay | Admitting: Orthopedic Surgery

## 2018-04-20 NOTE — Telephone Encounter (Signed)
Copy of office visit (last office visit by Dr Aline Brochure) date of service 06/12/17 invoice summary faxed as per request to [place], attention [place] ph#[phone]; patient's authorization on file; patient aware
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
